# Patient Record
Sex: Female | Born: 1970
Health system: Southern US, Community
[De-identification: ages and names within clinical notes are randomized; demographics above are authoritative.]

## PROBLEM LIST (undated history)

## (undated) DIAGNOSIS — F419 Anxiety disorder, unspecified: Secondary | ICD-10-CM

## (undated) DIAGNOSIS — F32A Depression, unspecified: Secondary | ICD-10-CM

## (undated) DIAGNOSIS — F329 Major depressive disorder, single episode, unspecified: Secondary | ICD-10-CM

## (undated) HISTORY — DX: Anxiety disorder, unspecified: F41.9

## (undated) HISTORY — DX: Major depressive disorder, single episode, unspecified: F32.9

## (undated) HISTORY — DX: Depression, unspecified: F32.A

## (undated) HISTORY — PX: OTHER SURGICAL HISTORY: SHX169

---
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
## 2012-02-29 ENCOUNTER — Encounter: Payer: Self-pay | Admitting: Family Medicine

## 2012-02-29 ENCOUNTER — Ambulatory Visit (INDEPENDENT_AMBULATORY_CARE_PROVIDER_SITE_OTHER): Payer: 59 | Admitting: Family Medicine

## 2012-02-29 VITALS — BP 120/88 | HR 84 | Temp 98.1°F | Ht 62.0 in | Wt 161.0 lb

## 2012-02-29 DIAGNOSIS — Z1231 Encounter for screening mammogram for malignant neoplasm of breast: Secondary | ICD-10-CM

## 2012-02-29 DIAGNOSIS — Z23 Encounter for immunization: Secondary | ICD-10-CM

## 2012-02-29 DIAGNOSIS — F329 Major depressive disorder, single episode, unspecified: Secondary | ICD-10-CM

## 2012-02-29 DIAGNOSIS — Z139 Encounter for screening, unspecified: Secondary | ICD-10-CM

## 2012-02-29 DIAGNOSIS — F419 Anxiety disorder, unspecified: Secondary | ICD-10-CM | POA: Insufficient documentation

## 2012-02-29 MED ORDER — BUPROPION HCL ER (XL) 150 MG PO TB24: 150.0000 mg | ORAL_TABLET | Freq: Every day | ORAL | Status: DC

## 2012-02-29 MED ORDER — ALPRAZOLAM 0.5 MG PO TABS: ORAL_TABLET | ORAL | Status: DC

## 2012-02-29 NOTE — Patient Instructions (Addendum)
Please make an appointment for a complete physical/pap smear on your way out in one month. We will check blood work at that time.  Please stop by to see Regina Salazar on your way out to set up your mammogram.

## 2012-02-29 NOTE — Progress Notes (Signed)
  Subjective:    Patient ID: Regina Salazar, female    DOB: March 25, 1971, 41 y.o.   MRN: [id number]  HPI  Very pleasant 41 yo here to establish care.  Moved here from [place] one year ago.  The transition is not going well.  She misses her friends terribly, just started working again. No history of anxiety or depression but past 6 months, feels depressed. Tearful, does not want to get out of bed in the morning. No panic attacks.  Sleeping ok. Feels appetite is "too good." No SI or HI.  G0- 3 adopted children.  Has never had a pap smear as she was always to anxious to finish the procedure.  Mom died of breast CA in her 70s.  No other females in family have had breast, cervical or uterine cancer that she is aware of.  She herself has never had a mammogram.  Patient Active Problem List  Diagnosis  . Depression   No past medical history on file. No past surgical history on file. History  Substance Use Topics  . Smoking status: Never Smoker   . Smokeless tobacco: Not on file  . Alcohol Use: Not on file   Family History  Problem Relation Age of Onset  . Cancer Mother 26    breast  . Cancer Father     colon  . Diabetes Father    No Known Allergies Current Outpatient Prescriptions on File Prior to Visit  Medication Sig Dispense Refill  . buPROPion (WELLBUTRIN XL) 150 MG 24 hr tablet Take 1 tablet (150 mg total) by mouth daily.  30 tablet  2   The PMH, PSH, Social History, Family History, Medications, and allergies have been reviewed in [facility], and have been updated if relevant.   Review of Systems See HPI    Objective:   Physical Exam BP 120/88  Pulse 84  Temp 98.1 F (36.7 C)  Ht 5\' 2"  (1.575 m)  Wt 161 lb (73.029 kg)  BMI 29.45 kg/m2  General:  Well-developed,well-nourished,in no acute distress; alert,appropriate and cooperative throughout examination Head:  normocephalic and atraumatic.   Lungs:  Normal respiratory effort, chest expands symmetrically. Lungs are  clear to auscultation, no crackles or wheezes. Heart:  Normal rate and regular rhythm. S1 and S2 normal without gallop, murmur, click, rub or other extra sounds. Neurologic:  alert & oriented X3 and gait normal.   Skin:  Intact without suspicious lesions or rashes Psych:  Cognition and judgment appear intact. Alert and cooperative with normal attention span and concentration. No apparent delusions, illusions, hallucinations        Assessment & Plan:   1. Other screening mammogram  Pos FH of breast CA Given rx for xanax to take prior to CPX - she will schedule CPX/Pap on her way out. MM Digital Screening  2. Depression  New- discussed treatment options.  She would like to try medication first- has a good support system at home.  Will start Wellbutrin 150 mg XL daily. Follow up in 1 month.   3. Immunization due  Tdap vaccine greater than or equal to 7yo IM  4. Need for prophylactic vaccination and inoculation against influenza  Flu vaccine greater than or equal to 3yo preservative free IM  5. Screening  TB Skin Test

## 2012-03-03 LAB — TB SKIN TEST: TB Skin Test: NEGATIVE

## 2012-03-19 ENCOUNTER — Ambulatory Visit
Admission: RE | Admit: 2012-03-19 | Discharge: 2012-03-19 | Disposition: A | Discharge status: Home / Self Care | Payer: 59 | Source: Ambulatory Visit | Attending: Family Medicine | Admitting: Family Medicine

## 2012-03-19 DIAGNOSIS — Z1231 Encounter for screening mammogram for malignant neoplasm of breast: Secondary | ICD-10-CM

## 2012-04-29 ENCOUNTER — Ambulatory Visit (INDEPENDENT_AMBULATORY_CARE_PROVIDER_SITE_OTHER): Payer: 59 | Admitting: Family Medicine

## 2012-04-29 VITALS — BP 138/92 | HR 76 | Temp 98.1°F | Ht 62.0 in | Wt 149.0 lb

## 2012-04-29 DIAGNOSIS — Z136 Encounter for screening for cardiovascular disorders: Secondary | ICD-10-CM

## 2012-04-29 DIAGNOSIS — F329 Major depressive disorder, single episode, unspecified: Secondary | ICD-10-CM

## 2012-04-29 DIAGNOSIS — Z0000 Encounter for general adult medical examination without abnormal findings: Secondary | ICD-10-CM

## 2012-04-29 LAB — LIPID PANEL
Cholesterol: 203 mg/dL — ABNORMAL HIGH (ref 0–200)
Total CHOL/HDL Ratio: 5
Triglycerides: 88 mg/dL (ref 0.0–149.0)
VLDL: 17.6 mg/dL (ref 0.0–40.0)

## 2012-04-29 LAB — COMPREHENSIVE METABOLIC PANEL
ALT: 16 U/L (ref 0–35)
AST: 21 U/L (ref 0–37)
Albumin: 4 g/dL (ref 3.5–5.2)
Alkaline Phosphatase: 49 U/L (ref 39–117)
Calcium: 8.6 mg/dL (ref 8.4–10.5)
Chloride: 105 mEq/L (ref 96–112)
Potassium: 3.8 mEq/L (ref 3.5–5.1)
Sodium: 136 mEq/L (ref 135–145)
Total Protein: 7.3 g/dL (ref 6.0–8.3)

## 2012-04-29 MED ORDER — BUPROPION HCL ER (XL) 150 MG PO TB24: 150.0000 mg | ORAL_TABLET | Freq: Every day | ORAL | Status: DC

## 2012-04-29 NOTE — Patient Instructions (Addendum)
Please schedule complete physical/pap smear in the next week or two (ok to use two acute slots).  You look great!  I am so proud of you!

## 2012-04-29 NOTE — Progress Notes (Signed)
  Subjective:    Patient ID: Regina Salazar, female    DOB: [birth year], 41 y.o.   MRN: [id number]  HPI  Very pleasant 41 yo initially here for CPX/ Pap smear but she states she is on her menstrual cycle and bleeding heavily.    She would like to follow up depression.  Moved here from [place] one year ago.  The transition was not going well.  She had been feeling very depressed, tearful with significant anhedonia.  No SI or HI.  Started Wellbutrin 150 mg XL when she established care here in September.  She feels this is working tremendously well at current dose.  Has not been tearful, "feels like her old self again."  Enjoying things again.      Patient Active Problem List  Diagnosis  . Depression  . Routine general medical examination at a health care facility   No past medical history on file. No past surgical history on file. History  Substance Use Topics  . Smoking status: Never Smoker   . Smokeless tobacco: Not on file  . Alcohol Use: Not on file   Family History  Problem Relation Age of Onset  . Cancer Mother 46    breast  . Cancer Father     colon  . Diabetes Father    No Known Allergies Current Outpatient Prescriptions on File Prior to Visit  Medication Sig Dispense Refill  . ALPRAZolam (XANAX) 0.5 MG tablet Please take 1 tablet 30 minutes prior to procedure, may repeat in 30 minutes.  3 tablet  0  . buPROPion (WELLBUTRIN XL) 150 MG 24 hr tablet Take 1 tablet (150 mg total) by mouth daily.  30 tablet  2   The PMH, PSH, Social History, Family History, Medications, and allergies have been reviewed in [facility], and have been updated if relevant.   Review of Systems See HPI    Objective:   Physical Exam Ht 5\' 2"  (1.575 m)  Wt 149 lb (67.586 kg)  BMI 27.25 kg/m2  General:  Well-developed,well-nourished,in no acute distress; alert,appropriate and cooperative throughout examination Head:  normocephalic and atraumatic.   Neurologic:  alert & oriented X3 and gait  normal.   Skin:  Intact without suspicious lesions or rashes Psych:  Cognition and judgment appear intact. Alert and cooperative with normal attention span and concentration. No apparent delusions, illusions, hallucinations     Assessment & Plan:   1. Depression    >15 min spent with face to face with patient, >50% counseling and/or coordinating care. Improved on current dose of Wellbutrin.  Rx refilled.  Call or return to clinic prn if these symptoms worsen or fail to improve as anticipated.

## 2012-05-12 ENCOUNTER — Ambulatory Visit: Payer: 59

## 2012-05-12 ENCOUNTER — Ambulatory Visit (INDEPENDENT_AMBULATORY_CARE_PROVIDER_SITE_OTHER): Payer: 59 | Admitting: Family Medicine

## 2012-05-12 ENCOUNTER — Encounter: Payer: Self-pay | Admitting: Family Medicine

## 2012-05-12 VITALS — BP 130/84 | HR 88 | Temp 98.0°F | Wt 149.0 lb

## 2012-05-12 DIAGNOSIS — Z124 Encounter for screening for malignant neoplasm of cervix: Secondary | ICD-10-CM

## 2012-05-12 DIAGNOSIS — Z0000 Encounter for general adult medical examination without abnormal findings: Secondary | ICD-10-CM

## 2012-05-12 DIAGNOSIS — F329 Major depressive disorder, single episode, unspecified: Secondary | ICD-10-CM

## 2012-05-12 NOTE — Progress Notes (Signed)
Subjective:    Patient ID: Regina Salazar, female    DOB: Nov 14, 1970, 41 y.o.   MRN: [id number]  HPI  Very pleasant 41 yo initially here for CPX/ Pap smear.  Mammogram in September.  G0.  Last pap smear was attempted several years ago.  She has never had a pap smear due to severe anxiety and pain with the procedure. I gave her rx for xanax to take 30 minutes prior to today's procedure.    Mild hyperglycemia- she did have M and M's the night prior.  No increased thirst or urination.   Lab Results  Component Value Date   CHOL 203* 04/29/2012   HDL 37.70* 04/29/2012   LDLDIRECT 147.3 04/29/2012   TRIG 88.0 04/29/2012   CHOLHDL 5 04/29/2012    Depression- Started Wellbutrin 150 mg XL when she established care here in September.  She feels this is working tremendously well at current dose.  Has not been tearful, "feels like her old self again."  Enjoying things again.      Patient Active Problem List  Diagnosis  . Depression  . Routine general medical examination at a health care facility   No past medical history on file. No past surgical history on file. History  Substance Use Topics  . Smoking status: Never Smoker   . Smokeless tobacco: Not on file  . Alcohol Use: Not on file   Family History  Problem Relation Age of Onset  . Cancer Mother 44    breast  . Cancer Father     colon  . Diabetes Father    No Known Allergies Current Outpatient Prescriptions on File Prior to Visit  Medication Sig Dispense Refill  . ALPRAZolam (XANAX) 0.5 MG tablet Please take 1 tablet 30 minutes prior to procedure, may repeat in 30 minutes.  3 tablet  0  . buPROPion (WELLBUTRIN XL) 150 MG 24 hr tablet Take 1 tablet (150 mg total) by mouth daily.  30 tablet  11   The PMH, PSH, Social History, Family History, Medications, and allergies have been reviewed in [facility], and have been updated if relevant.   Review of Systems See HPI Patient reports no  vision/ hearing changes,anorexia, weight  change, fever ,adenopathy, persistant / recurrent hoarseness, swallowing issues, chest pain, edema,persistant / recurrent cough, hemoptysis, dyspnea(rest, exertional, paroxysmal nocturnal), gastrointestinal  bleeding (melena, rectal bleeding), abdominal pain, excessive heart burn, GU symptoms(dysuria, hematuria, pyuria, voiding/incontinence  Issues) syncope, focal weakness, severe memory loss, concerning skin lesions, depression, anxiety, abnormal bruising/bleeding, major joint swelling, breast masses or abnormal vaginal bleeding.       Objective:   Physical Exam BP 130/84  Pulse 88  Temp 98 F (36.7 C)  Wt 149 lb (67.586 kg)   General:  Well-developed,well-nourished,in no acute distress; alert,appropriate and cooperative throughout examination Head:  normocephalic and atraumatic.   Eyes:  vision grossly intact, pupils equal, pupils round, and pupils reactive to light.   Ears:  R ear normal and L ear normal.   Nose:  no external deformity.   Mouth:  good dentition.   Lungs:  Normal respiratory effort, chest expands symmetrically. Lungs are clear to auscultation, no crackles or wheezes. Heart:  Normal rate and regular rhythm. S1 and S2 normal without gallop, murmur, click, rub or other extra sounds. Abdomen:  Bowel sounds positive,abdomen soft and non-tender without masses, organomegaly or hernias noted. Rectal:  no external abnormalities.   Genitalia:  Pelvic Exam:        External: normal female genitalia  without lesions or masses       Attempted pap smear but pt unable to tolerate Msk:  No deformity or scoliosis noted of thoracic or lumbar spine.   Extremities:  No clubbing, cyanosis, edema, or deformity noted with normal full range of motion of all joints.   Neurologic:  alert & oriented X3 and gait normal.   Skin:  Intact without suspicious lesions or rashes Cervical Nodes:  No lymphadenopathy noted Axillary Nodes:  No palpable lymphadenopathy Psych:  Cognition and judgment appear  intact. Alert and cooperative with normal attention span and concentration. No apparent delusions, illusions, hallucinations     Assessment & Plan:   1. Routine general medical examination at a health care facility  Reviewed preventive care protocols, scheduled due services, and updated immunizations Discussed nutrition, exercise, diet, and healthy lifestyle.    2. Depression  Improved with Wellbutrin.   3. Screening for cervical cancer  Refer to GYN- will likely need pap smear under sedation. The patient indicates understanding of these issues and agrees with the plan.  Ambulatory referral to Gynecology

## 2012-05-12 NOTE — Patient Instructions (Addendum)
Good to see you. Please stop by to see [name] on your way out.  Have a wonderful holiday.

## 2012-05-19 ENCOUNTER — Other Ambulatory Visit: Payer: Self-pay | Admitting: *Deleted

## 2012-05-19 MED ORDER — BUPROPION HCL ER (XL) 150 MG PO TB24: 150.0000 mg | ORAL_TABLET | Freq: Every day | ORAL | Status: DC

## 2012-05-20 ENCOUNTER — Ambulatory Visit (INDEPENDENT_AMBULATORY_CARE_PROVIDER_SITE_OTHER): Payer: 59 | Admitting: Family Medicine

## 2012-05-20 ENCOUNTER — Encounter: Payer: Self-pay | Admitting: Family Medicine

## 2012-05-20 VITALS — BP 133/88 | HR 95 | Ht 62.0 in | Wt 150.0 lb

## 2012-05-20 DIAGNOSIS — F411 Generalized anxiety disorder: Secondary | ICD-10-CM

## 2012-05-20 NOTE — Progress Notes (Signed)
  Subjective:    Patient ID: Regina Salazar, female    DOB: 1970/12/21, 41 y.o.   MRN: [id number]  HPI New pt. Today.  Referred for inability to have pap smear done.  Reports difficulty with intercourse as well.  States she is just too panicked to have it done and nervous.  Denies any h/o of abuse or trauma as a child.  Has never had a pap.  Has never been pregnant.  She does have normal cycles.  Past Medical History  Diagnosis Date  . Depression    Past Surgical History  Procedure Date  . Cystectomy on writst 1980   Family History  Problem Relation Age of Onset  . Cancer Mother 92    breast  . Cancer Father     colon  . Diabetes Father    History   Social History  . Marital Status: Married    Spouse Name: N/A    Number of Children: N/A  . Years of Education: N/A   Occupational History  . Not on file.   Social History Main Topics  . Smoking status: Never Smoker   . Smokeless tobacco: Never Used  . Alcohol Use: No  . Drug Use: No  . Sexually Active: Yes -- Female partner(s)    Birth Control/ Protection: None   Other Topics Concern  . Not on file   Social History Narrative   Moved here from [place] last year for her husband's job.3 adopted children ages 9, 34, 65.Works at a daycare.   Current Outpatient Prescriptions on File Prior to Visit  Medication Sig Dispense Refill  . buPROPion (WELLBUTRIN XL) 150 MG 24 hr tablet Take 1 tablet (150 mg total) by mouth daily.  90 tablet  3   No Known Allergies    Review of Systems  Constitutional: Negative for fever and chills.  HENT: Negative for nosebleeds, congestion and rhinorrhea.   Eyes: Negative for visual disturbance.  Respiratory: Negative for chest tightness and shortness of breath.   Cardiovascular: Negative for chest pain.  Gastrointestinal: Negative for nausea, vomiting, abdominal pain, diarrhea, constipation, blood in stool and abdominal distention.  Genitourinary: Negative for dysuria, frequency, vaginal  bleeding and menstrual problem.  Musculoskeletal: Negative for arthralgias.  Skin: Negative for rash.  Neurological: Negative for dizziness and headaches.  Psychiatric/Behavioral: Negative for behavioral problems, sleep disturbance and dysphoric mood.  All other systems reviewed and are negative.       Objective:   Physical Exam  Constitutional: She is oriented to person, place, and time. She appears well-developed and well-nourished.  HENT:  Head: Normocephalic and atraumatic.  Eyes: No scleral icterus.  Neck: Normal range of motion. No thyromegaly present.  Cardiovascular: Normal rate, regular rhythm and intact distal pulses.   No murmur heard. Pulmonary/Chest: Effort normal and breath sounds normal.  Abdominal: Soft. She exhibits no distension. There is no tenderness.  Neurological: She is alert and oriented to person, place, and time.  Skin: Skin is warm and dry.          Assessment & Plan:  Anxiety with pap smear--will book for EUA with pap this month.

## 2012-05-28 ENCOUNTER — Encounter (HOSPITAL_COMMUNITY): Payer: Self-pay | Admitting: Pharmacist

## 2012-06-05 ENCOUNTER — Ambulatory Visit (HOSPITAL_COMMUNITY): Payer: 59 | Admitting: Anesthesiology

## 2012-06-05 ENCOUNTER — Ambulatory Visit (HOSPITAL_COMMUNITY)
Admission: RE | Admit: 2012-06-05 | Discharge: 2012-06-05 | Disposition: A | Discharge status: Home / Self Care | Payer: 59 | Source: Ambulatory Visit | Attending: Family Medicine | Admitting: Family Medicine

## 2012-06-05 ENCOUNTER — Encounter (HOSPITAL_COMMUNITY): Payer: Self-pay | Admitting: Anesthesiology

## 2012-06-05 ENCOUNTER — Encounter (HOSPITAL_COMMUNITY)
Admission: RE | Disposition: A | Discharge status: Home / Self Care | Payer: Self-pay | Source: Ambulatory Visit | Attending: Family Medicine

## 2012-06-05 DIAGNOSIS — Z01419 Encounter for gynecological examination (general) (routine) without abnormal findings: Secondary | ICD-10-CM | POA: Diagnosis not present

## 2012-06-05 DIAGNOSIS — Z0000 Encounter for general adult medical examination without abnormal findings: Secondary | ICD-10-CM

## 2012-06-05 DIAGNOSIS — Z124 Encounter for screening for malignant neoplasm of cervix: Secondary | ICD-10-CM

## 2012-06-05 HISTORY — PX: EXAMINATION UNDER ANESTHESIA: SHX1540

## 2012-06-05 LAB — CBC
Hemoglobin: 14.8 g/dL (ref 12.0–15.0)
Platelets: 316 10*3/uL (ref 150–400)
RBC: 4.81 MIL/uL (ref 3.87–5.11)

## 2012-06-05 LAB — PREGNANCY, URINE: Preg Test, Ur: NEGATIVE

## 2012-06-05 SURGERY — EXAM UNDER ANESTHESIA
Anesthesia: Monitor Anesthesia Care | Site: Vagina | Wound class: Clean Contaminated

## 2012-06-05 MED ORDER — MIDAZOLAM HCL 2 MG/2ML IJ SOLN: 0.5000 mg | Freq: Once | INTRAMUSCULAR | Status: DC | PRN

## 2012-06-05 MED ORDER — PROMETHAZINE HCL 25 MG/ML IJ SOLN: 6.2500 mg | INTRAMUSCULAR | Status: DC | PRN

## 2012-06-05 MED ORDER — MIDAZOLAM HCL 2 MG/2ML IJ SOLN
INTRAMUSCULAR | Status: AC
  Filled 2012-06-05: qty 2

## 2012-06-05 MED ORDER — MEPERIDINE HCL 25 MG/ML IJ SOLN: 6.2500 mg | INTRAMUSCULAR | Status: DC | PRN

## 2012-06-05 MED ORDER — LIDOCAINE HCL (CARDIAC) 20 MG/ML IV SOLN
INTRAVENOUS | Status: AC
  Filled 2012-06-05: qty 5

## 2012-06-05 MED ORDER — PROPOFOL 10 MG/ML IV EMUL
INTRAVENOUS | Status: AC
  Filled 2012-06-05: qty 20

## 2012-06-05 MED ORDER — KETOROLAC TROMETHAMINE 30 MG/ML IJ SOLN: 15.0000 mg | Freq: Once | INTRAMUSCULAR | Status: DC | PRN

## 2012-06-05 MED ORDER — ONDANSETRON HCL 4 MG/2ML IJ SOLN
INTRAMUSCULAR | Status: AC
  Filled 2012-06-05: qty 2

## 2012-06-05 MED ORDER — ONDANSETRON HCL 4 MG/2ML IJ SOLN
INTRAMUSCULAR | Status: DC | PRN
  Administered 2012-06-05: 4 mg via INTRAVENOUS

## 2012-06-05 MED ORDER — FENTANYL CITRATE 0.05 MG/ML IJ SOLN: 25.0000 ug | INTRAMUSCULAR | Status: DC | PRN

## 2012-06-05 MED ORDER — FENTANYL CITRATE 0.05 MG/ML IJ SOLN
INTRAMUSCULAR | Status: AC
  Filled 2012-06-05: qty 2

## 2012-06-05 MED ORDER — FENTANYL CITRATE 0.05 MG/ML IJ SOLN
INTRAMUSCULAR | Status: DC | PRN
  Administered 2012-06-05: 100 ug via INTRAVENOUS

## 2012-06-05 MED ORDER — LACTATED RINGERS IV SOLN
INTRAVENOUS | Status: DC
  Administered 2012-06-05 (×2): via INTRAVENOUS

## 2012-06-05 MED ORDER — LIDOCAINE HCL (CARDIAC) 20 MG/ML IV SOLN
INTRAVENOUS | Status: DC | PRN
  Administered 2012-06-05 (×2): 50 mg via INTRAVENOUS

## 2012-06-05 MED ORDER — LACTATED RINGERS IV SOLN: INTRAVENOUS | Status: DC

## 2012-06-05 MED ORDER — PROPOFOL 10 MG/ML IV EMUL
INTRAVENOUS | Status: DC | PRN
  Administered 2012-06-05: 30 mg via INTRAVENOUS
  Administered 2012-06-05: 20 mg via INTRAVENOUS

## 2012-06-05 MED ORDER — MIDAZOLAM HCL 5 MG/5ML IJ SOLN
INTRAMUSCULAR | Status: DC | PRN
  Administered 2012-06-05: 2 mg via INTRAVENOUS

## 2012-06-05 SURGICAL SUPPLY — 6 items
COVER TABLE BACK 60X90 (DRAPES) ×2 IMPLANT
GLOVE BIO SURGEON STRL SZ 6.5 (GLOVE) ×2 IMPLANT
GLOVE BIOGEL PI IND STRL 7.0 (GLOVE) ×1 IMPLANT
GLOVE BIOGEL PI INDICATOR 7.0 (GLOVE) ×1
SPONGE GAUZE 4X4 12PLY (GAUZE/BANDAGES/DRESSINGS) ×2 IMPLANT
TOWEL OR 17X24 6PK STRL BLUE (TOWEL DISPOSABLE) ×2 IMPLANT

## 2012-06-05 NOTE — Transfer of Care (Signed)
Immediate Anesthesia Transfer of Care Note  Patient: Regina Salazar  Procedure(s) Performed: Procedure(s) (LRB) with comments: EXAM UNDER ANESTHESIA (N/A) - pap testing  Patient Location: PACU  Anesthesia Type:MAC  Level of Consciousness: awake, alert  and oriented  Airway & Oxygen Therapy: Patient Spontanous Breathing  Post-op Assessment: Report given to PACU RN and Post -op Vital signs reviewed and stable  Post vital signs: Reviewed and stable  Complications: No apparent anesthesia complications

## 2012-06-05 NOTE — Op Note (Signed)
Preoperative diagnosis: Unable to tolerate exam in the office, Need for Pap smear screening  Postoperative diagnosis: Same  Procedure: Exam under anesthesia, Pap smear  Surgeon: Tinnie Gens, M.D.  Anesthesia: MAC  Findings: 8 week size uterus, nulliparous-appearing cervix  without lesion  Estimated blood loss: Less than 25 cc  Specimen: None  Reason for procedure: Patient is a 41 year old nulliparous patient who is unable to tolerate exam in the office. She has never had a Pap smear. She requests screening.  Procedure: Patient taken to the OR where she was placed in dorsal lithotomy in Allen stirrups. A timeout was performed. A speculum was placed inside the vagina. The cervix was visualized and Pap smear obtained. The speculum was removed from the vagina. An exam revealed 8 week size uterus that was firm. There were no adnexal mass or tenderness. The patient was awakened. All instrument, needle, and lap counts correct x2. The patient was taken to recovery, stable.  Reva Bores, MD 06/05/2012 10:52 AM

## 2012-06-05 NOTE — Anesthesia Postprocedure Evaluation (Signed)
Anesthesia Post Note  Patient: Regina Salazar  Procedure(s) Performed: Procedure(s) (LRB): EXAM UNDER ANESTHESIA (N/A)  Anesthesia type: MAC  Patient location: PACU  Post pain: Pain level controlled  Post assessment: Post-op Vital signs reviewed  Last Vitals:  Filed Vitals:   06/05/12 1100  BP: 107/68  Pulse: 75  Temp:   Resp: 20    Post vital signs: Reviewed  Level of consciousness: sedated  Complications: No apparent anesthesia complications

## 2012-06-05 NOTE — H&P (Signed)
  Justise Ehmann is an 41 y.o. G0P0 Unknown female.   Chief Complaint: Needs pap smear  HPI: 41 y.o.G0P0 who has never had a pap smear and is in need of pap.  Cannot tolerate procedure in the OR.   Past Medical History  Diagnosis Date  . Depression     Past Surgical History  Procedure Date  . Cystectomy on writst 1980    Family History  Problem Relation Age of Onset  . Cancer Mother 45    breast  . Cancer Father     colon  . Diabetes Father    Social History:  reports that she has never smoked. She has never used smokeless tobacco. She reports that she does not drink alcohol or use illicit drugs.  Allergies: No Known Allergies  Medications Prior to Admission  Medication Sig Dispense Refill  . buPROPion (WELLBUTRIN XL) 150 MG 24 hr tablet Take 1 tablet (150 mg total) by mouth daily.  90 tablet  3  . ibuprofen (ADVIL,MOTRIN) 200 MG tablet Take 400 mg by mouth as needed. headache         A comprehensive review of systems was negative.  Blood pressure 128/98, pulse 68, temperature 97.7 F (36.5 C), temperature source Oral, resp. rate 16, height 5\' 2"  (1.575 m), weight 150 lb (68.04 kg), last menstrual period 04/29/2012, SpO2 100.00%. BP 128/98  Pulse 68  Temp 97.7 F (36.5 C) (Oral)  Resp 16  Ht 5\' 2"  (1.575 m)  Wt 150 lb (68.04 kg)  BMI 27.44 kg/m2  SpO2 100%  LMP 04/29/2012 General appearance: alert, cooperative and appears stated age Head: Normocephalic, without obvious abnormality, atraumatic Neck: supple, symmetrical, trachea midline Lungs: clear to auscultation bilaterally Heart: regular rate and rhythm, S1, S2 normal, no murmur, click, rub or gallop Abdomen: soft, non-tender; bowel sounds normal; no masses,  no organomegaly Extremities: extremities normal, atraumatic, no cyanosis or edema Pulses: 2+ and symmetric Skin: Skin color, texture, turgor normal. No rashes or lesions Neurologic: Grossly normal   Lab Results  Component Value Date   WBC 7.6  06/05/2012   HGB 14.8 06/05/2012   HCT 43.2 06/05/2012   MCV 89.8 06/05/2012   PLT 316 06/05/2012   Lab Results  Component Value Date   PREGTESTUR NEGATIVE 06/05/2012     Assessment/Plan Patient Active Problem List  Diagnosis  . Depression  . Routine general medical examination at a health care facility   For exam under anesthesia with pap smear.  Hayven Croy S 06/05/2012, 10:10 AM

## 2012-06-05 NOTE — Anesthesia Preprocedure Evaluation (Addendum)
Anesthesia Evaluation  Patient identified by MRN, date of birth, ID band Patient awake    Reviewed: Allergy & Precautions, H&P , Patient's Chart, lab work & pertinent test results, reviewed documented beta blocker date and time   History of Anesthesia Complications Negative for: history of anesthetic complications  Airway Mallampati: II TM Distance: >3 FB Neck ROM: full    Dental No notable dental hx.    Pulmonary neg pulmonary ROS,  breath sounds clear to auscultation  Pulmonary exam normal       Cardiovascular Exercise Tolerance: Good negative cardio ROS  Rhythm:regular Rate:Normal     Neuro/Psych PSYCHIATRIC DISORDERS Depression negative neurological ROS  negative psych ROS   GI/Hepatic negative GI ROS, Neg liver ROS,   Endo/Other  negative endocrine ROS  Renal/GU negative Renal ROS     Musculoskeletal   Abdominal   Peds  Hematology negative hematology ROS (+)   Anesthesia Other Findings   Reproductive/Obstetrics negative OB ROS                           Anesthesia Physical Anesthesia Plan  ASA: II  Anesthesia Plan: MAC   Post-op Pain Management:    Induction:   Airway Management Planned:   Additional Equipment:   Intra-op Plan:   Post-operative Plan:   Informed Consent: I have reviewed the patients History and Physical, chart, labs and discussed the procedure including the risks, benefits and alternatives for the proposed anesthesia with the patient or authorized representative who has indicated his/her understanding and acceptance.   Dental Advisory Given  Plan Discussed with: CRNA, Surgeon and Anesthesiologist  Anesthesia Plan Comments:         Anesthesia Quick Evaluation

## 2012-06-06 ENCOUNTER — Encounter (HOSPITAL_COMMUNITY): Payer: Self-pay | Admitting: Family Medicine

## 2012-07-21 ENCOUNTER — Ambulatory Visit (INDEPENDENT_AMBULATORY_CARE_PROVIDER_SITE_OTHER): Payer: 59 | Admitting: Family Medicine

## 2012-07-21 ENCOUNTER — Encounter: Payer: Self-pay | Admitting: Family Medicine

## 2012-07-21 VITALS — BP 130/82 | HR 84 | Temp 98.0°F | Wt 146.0 lb

## 2012-07-21 DIAGNOSIS — J329 Chronic sinusitis, unspecified: Secondary | ICD-10-CM

## 2012-07-21 MED ORDER — AMOXICILLIN-POT CLAVULANATE 875-125 MG PO TABS: 1.0000 | ORAL_TABLET | Freq: Two times a day (BID) | ORAL | Status: DC

## 2012-07-21 NOTE — Progress Notes (Signed)
SUBJECTIVE:  Regina Salazar is a 42 y.o. female who complains of coryza, congestion, sneezing, sore throat, myalgias and bilateral sinus pain for 21 days. She denies a history of anorexia, chest pain and chills and denies a history of asthma. Patient denies smoke cigarettes.   Patient Active Problem List  Diagnosis  . Depression  . Routine general medical examination at a health care facility   Past Medical History  Diagnosis Date  . Depression    Past Surgical History  Procedure Date  . Cystectomy on writst 1980  . Examination under anesthesia 06/05/2012    Procedure: EXAM UNDER ANESTHESIA;  Surgeon: Reva Bores, MD;  Location: [facility];  Service: Gynecology;  Laterality: N/A;  pap testing   History  Substance Use Topics  . Smoking status: Never Smoker   . Smokeless tobacco: Never Used  . Alcohol Use: No   Family History  Problem Relation Age of Onset  . Cancer Mother 56    breast  . Cancer Father     colon  . Diabetes Father    No Known Allergies Current Outpatient Prescriptions on File Prior to Visit  Medication Sig Dispense Refill  . buPROPion (WELLBUTRIN XL) 150 MG 24 hr tablet Take 1 tablet (150 mg total) by mouth daily.  90 tablet  3  . ibuprofen (ADVIL,MOTRIN) 200 MG tablet Take 400 mg by mouth as needed. headache       The PMH, PSH, Social History, Family History, Medications, and allergies have been reviewed in [facility], and have been updated if relevant.  OBJECTIVE: BP 130/82  Pulse 84  Temp 98 F (36.7 C)  Wt 146 lb (66.225 kg)  She appears well, vital signs are as noted. Ears normal.  Throat and pharynx normal.  Neck supple. No adenopathy in the neck. Nose is congested. Maxillary sinuses very TTP, left > right The chest is clear, without wheezes or rales.  ASSESSMENT:  sinusitis  PLAN: Given duration and progression of symptoms, will treat for bacterial sinusitis with 10 day course of Augmentin. Symptomatic therapy suggested: push fluids, rest and return  office visit prn if symptoms persist or worsen. Call or return to clinic prn if these symptoms worsen or fail to improve as anticipated.

## 2012-07-21 NOTE — Patient Instructions (Signed)
Take antibiotic as directed- Augmentin - 1 tablet twice daily for 10 days.  Drink lots of fluids.  Treat sympotmatically with Mucinex, nasal saline irrigation, and Tylenol/Ibuprofen. Also try claritin D or zyrtec D over the counter- two times a day as needed ( have to sign for them at pharmacy). You can use warm compresses.  Cough suppressant at night. Call if not improving as expected in 5-7 days.

## 2013-04-28 ENCOUNTER — Other Ambulatory Visit: Payer: Self-pay | Admitting: Family Medicine

## 2013-04-28 NOTE — Telephone Encounter (Signed)
Received refill request electronically. Last office visit 07/21/12. Is it okay to refill medication?

## 2013-06-01 ENCOUNTER — Ambulatory Visit (INDEPENDENT_AMBULATORY_CARE_PROVIDER_SITE_OTHER): Payer: 59 | Admitting: Internal Medicine

## 2013-06-01 ENCOUNTER — Encounter: Payer: Self-pay | Admitting: Internal Medicine

## 2013-06-01 VITALS — BP 138/80 | HR 100 | Temp 98.5°F | Ht 62.25 in | Wt 145.0 lb

## 2013-06-01 DIAGNOSIS — F411 Generalized anxiety disorder: Secondary | ICD-10-CM

## 2013-06-01 DIAGNOSIS — F3289 Other specified depressive episodes: Secondary | ICD-10-CM

## 2013-06-01 DIAGNOSIS — F329 Major depressive disorder, single episode, unspecified: Secondary | ICD-10-CM

## 2013-06-01 MED ORDER — ALPRAZOLAM 0.5 MG PO TABS: 0.5000 mg | ORAL_TABLET | Freq: Every evening | ORAL | Status: DC | PRN

## 2013-06-01 MED ORDER — BUPROPION HCL ER (XL) 300 MG PO TB24: 300.0000 mg | ORAL_TABLET | Freq: Every day | ORAL | Status: DC

## 2013-06-01 NOTE — Assessment & Plan Note (Signed)
Situational but she did not want to discuss She feels like she would only need short term medication to treat for now Support offered RX for xanax 0.5 mg #30, no refills (short term only)

## 2013-06-01 NOTE — Progress Notes (Signed)
Subjective:    Patient ID: Regina Salazar, female    DOB: June 10, 1971, 42 y.o.   MRN: [id number]  HPI  Pt presents to the clinic today with c/o anxiety. She noticed that she was more anxious, starting 2 days ago. She noticed that her heart rate was up, tingly all over and difficulty sleeping. She did have a situational event happen 2 days ago. She does not want to discuss it. She is very tearful. She also thinks her wellbutrin needs to be increased.   Review of Systems      Past Medical History  Diagnosis Date  . Depression     Current Outpatient Prescriptions  Medication Sig Dispense Refill  . amoxicillin-clavulanate (AUGMENTIN) 875-125 MG per tablet Take 1 tablet by mouth 2 (two) times daily.  20 tablet  0  . buPROPion (WELLBUTRIN XL) 150 MG 24 hr tablet TAKE 1 TABLET DAILY  90 tablet  0  . ibuprofen (ADVIL,MOTRIN) 200 MG tablet Take 400 mg by mouth as needed. headache       No current facility-administered medications for this visit.    No Known Allergies  Family History  Problem Relation Age of Onset  . Cancer Mother 75    breast  . Cancer Father     colon  . Diabetes Father     History   Social History  . Marital Status: Married    Spouse Name: N/A    Number of Children: N/A  . Years of Education: N/A   Occupational History  . Not on file.   Social History Main Topics  . Smoking status: Never Smoker   . Smokeless tobacco: Never Used  . Alcohol Use: No  . Drug Use: No  . Sexual Activity: Yes    Partners: Male    Birth Control/ Protection: None   Other Topics Concern  . Not on file   Social History Narrative   Moved here from [place] last year for her husband's job.   3 adopted children ages 64, 41, 98.      Works at a daycare.     Constitutional: Denies fever, malaise, fatigue, headache or abrupt weight changes.  Respiratory: Denies difficulty breathing, shortness of breath, cough or sputum production.   Cardiovascular: Denies chest pain,  chest tightness, palpitations or swelling in the hands or feet.   Psych: Pt reports anxiety. Denies depression, SI/HI.  No other specific complaints in a complete review of systems (except as listed in HPI above).  Objective:   Physical Exam   BP 138/80  Pulse 100  Temp(Src) 98.5 F (36.9 C) (Oral)  Ht 5' 2.25" (1.581 m)  Wt 145 lb (65.772 kg)  BMI 26.31 kg/m2  SpO2 97%  LMP 05/04/2013 Wt Readings from Last 3 Encounters:  06/01/13 145 lb (65.772 kg)  07/21/12 146 lb (66.225 kg)  06/02/12 150 lb (68.04 kg)    General: Appears her stated age, well developed, well nourished in NAD.  Cardiovascular: Normal rate and rhythm. S1,S2 noted.  No murmur, rubs or gallops noted. No JVD or BLE edema. No carotid bruits noted. Pulmonary/Chest: Normal effort and positive vesicular breath sounds. No respiratory distress. No wheezes, rales or ronchi noted.  Psychiatric: Mood tearful and affect flat. Behavior is normal. Judgment and thought content normal.     BMET    Component Value Date/Time   NA 136 04/29/2012 0851   K 3.8 04/29/2012 0851   CL 105 04/29/2012 0851   CO2 20 04/29/2012 0851  GLUCOSE 106* 04/29/2012 0851   BUN 16 04/29/2012 0851   CREATININE 0.7 04/29/2012 0851   CALCIUM 8.6 04/29/2012 0851    Lipid Panel     Component Value Date/Time   CHOL 203* 04/29/2012 0851   TRIG 88.0 04/29/2012 0851   HDL 37.70* 04/29/2012 0851   CHOLHDL 5 04/29/2012 0851   VLDL 17.6 04/29/2012 0851    CBC    Component Value Date/Time   WBC 7.6 06/05/2012 0931   RBC 4.81 06/05/2012 0931   HGB 14.8 06/05/2012 0931   HCT 43.2 06/05/2012 0931   PLT 316 06/05/2012 0931   MCV 89.8 06/05/2012 0931   MCH 30.8 06/05/2012 0931   MCHC 34.3 06/05/2012 0931   RDW 12.5 06/05/2012 0931    Hgb A1C No results found for this basename: HGBA1C        Assessment & Plan:

## 2013-06-01 NOTE — Assessment & Plan Note (Signed)
Worse-situational Increased wellbutrin to 300 mg daily

## 2013-06-01 NOTE — Patient Instructions (Signed)

## 2013-06-01 NOTE — Progress Notes (Signed)
Pre-visit discussion using our clinic review tool. No additional management support is needed unless otherwise documented below in the visit note.  

## 2013-06-29 ENCOUNTER — Other Ambulatory Visit: Payer: Self-pay | Admitting: Internal Medicine

## 2013-06-29 NOTE — Telephone Encounter (Signed)
Last filled 12/15 and had an office visit with Webb Silversmith--

## 2013-07-02 ENCOUNTER — Encounter: Payer: Self-pay | Admitting: Internal Medicine

## 2013-07-02 ENCOUNTER — Ambulatory Visit (INDEPENDENT_AMBULATORY_CARE_PROVIDER_SITE_OTHER): Payer: 59 | Admitting: Internal Medicine

## 2013-07-02 VITALS — BP 110/80 | HR 79 | Temp 98.2°F | Wt 136.0 lb

## 2013-07-02 DIAGNOSIS — F341 Dysthymic disorder: Secondary | ICD-10-CM

## 2013-07-02 DIAGNOSIS — F329 Major depressive disorder, single episode, unspecified: Secondary | ICD-10-CM

## 2013-07-02 DIAGNOSIS — F419 Anxiety disorder, unspecified: Principal | ICD-10-CM

## 2013-07-02 MED ORDER — ALPRAZOLAM 0.5 MG PO TABS: 0.5000 mg | ORAL_TABLET | Freq: Every evening | ORAL | Status: DC | PRN

## 2013-07-02 NOTE — Assessment & Plan Note (Signed)
She has tried a few nights without the xanax and did have difficulty sleeping and worsening anxiety.  She would like to continue xanax for now but I reminded her this would be short term.

## 2013-07-02 NOTE — Assessment & Plan Note (Signed)
Better with increased dose Will continue 300 mg daily

## 2013-07-02 NOTE — Progress Notes (Signed)
Pre-visit discussion using our clinic review tool. No additional management support is needed unless otherwise documented below in the visit note.  

## 2013-07-02 NOTE — Patient Instructions (Signed)

## 2013-07-02 NOTE — Progress Notes (Signed)
Subjective:    Patient ID: Regina Salazar, female    DOB: [birth year], 43 y.o.   MRN: [id number]  HPI  Pt presents to the clinic today to f/u anxiety and depression. During the last visit, she was having situational issues that was causing worsening depression and increased anxiety. She was started on low dose xanax at night and her wellbutrin was increased. She reports that the medication changes have worked well. She is sleeping better and feeling less anxious. She denies SI/HI.  Review of Systems      Past Medical History  Diagnosis Date  . Depression     Current Outpatient Prescriptions  Medication Sig Dispense Refill  . ALPRAZolam (XANAX) 0.5 MG tablet Take 1 tablet (0.5 mg total) by mouth at bedtime as needed for anxiety.  30 tablet  0  . buPROPion (WELLBUTRIN XL) 300 MG 24 hr tablet Take 1 tablet (300 mg total) by mouth daily.  90 tablet  0  . ibuprofen (ADVIL,MOTRIN) 200 MG tablet Take 400 mg by mouth as needed. headache       No current facility-administered medications for this visit.    No Known Allergies  Family History  Problem Relation Age of Onset  . Cancer Mother 50    breast  . Cancer Father     colon  . Diabetes Father     History   Social History  . Marital Status: Married    Spouse Name: N/A    Number of Children: N/A  . Years of Education: N/A   Occupational History  . Not on file.   Social History Main Topics  . Smoking status: Never Smoker   . Smokeless tobacco: Never Used  . Alcohol Use: No  . Drug Use: No  . Sexual Activity: Yes    Partners: Male    Birth Control/ Protection: None   Other Topics Concern  . Not on file   Social History Narrative   Moved here from [place] last year for her husband's job.   3 adopted children ages 63, 47, 11.      Works at a daycare.     Constitutional: Denies fever, malaise, fatigue, headache or abrupt weight changes.   Neurological: Denies dizziness, difficulty with memory, difficulty with  speech or problems with balance and coordination.  Psych: Pt denies worsening depression, anxiety, SI/HI.  No other specific complaints in a complete review of systems (except as listed in HPI above).  Objective:   Physical Exam   BP 110/80  Pulse 79  Temp(Src) 98.2 F (36.8 C) (Oral)  Wt 136 lb (61.689 kg)  SpO2 98% Wt Readings from Last 3 Encounters:  07/02/13 136 lb (61.689 kg)  06/01/13 145 lb (65.772 kg)  07/21/12 146 lb (66.225 kg)    General: Appears her stated age, well developed, well nourished in NAD. Cardiovascular: Normal rate and rhythm. S1,S2 noted.  No murmur, rubs or gallops noted. No JVD or BLE edema. No carotid bruits noted. Pulmonary/Chest: Normal effort and positive vesicular breath sounds. No respiratory distress. No wheezes, rales or ronchi noted.   Neurological: Alert and oriented. Cranial nerves II-XII intact. Coordination normal. +DTRs bilaterally. Psychiatric: Mood and affect normal. Behavior is normal. Judgment and thought content normal.     BMET    Component Value Date/Time   NA 136 04/29/2012 0851   K 3.8 04/29/2012 0851   CL 105 04/29/2012 0851   CO2 20 04/29/2012 0851   GLUCOSE 106* 04/29/2012 0851   BUN 16  04/29/2012 0851   CREATININE 0.7 04/29/2012 0851   CALCIUM 8.6 04/29/2012 0851    Lipid Panel     Component Value Date/Time   CHOL 203* 04/29/2012 0851   TRIG 88.0 04/29/2012 0851   HDL 37.70* 04/29/2012 0851   CHOLHDL 5 04/29/2012 0851   VLDL 17.6 04/29/2012 0851    CBC    Component Value Date/Time   WBC 7.6 06/05/2012 0931   RBC 4.81 06/05/2012 0931   HGB 14.8 06/05/2012 0931   HCT 43.2 06/05/2012 0931   PLT 316 06/05/2012 0931   MCV 89.8 06/05/2012 0931   MCH 30.8 06/05/2012 0931   MCHC 34.3 06/05/2012 0931   RDW 12.5 06/05/2012 0931    Hgb A1C No results found for this basename: HGBA1C        Assessment & Plan:

## 2013-07-03 ENCOUNTER — Telehealth: Payer: Self-pay

## 2013-07-03 NOTE — Telephone Encounter (Signed)
Can you please make sure the xanax was called in yesterday?

## 2013-07-03 NOTE — Telephone Encounter (Signed)
Pt left v/m pt was seen 07/02/13; [facility] has not received alprazolam rx that was to be called in on 07/02/13.Please advise.pt request cb.

## 2013-07-07 ENCOUNTER — Encounter: Payer: Self-pay | Admitting: Radiology

## 2013-07-08 ENCOUNTER — Encounter: Payer: Self-pay | Admitting: Family Medicine

## 2013-07-08 ENCOUNTER — Ambulatory Visit (INDEPENDENT_AMBULATORY_CARE_PROVIDER_SITE_OTHER): Payer: 59 | Admitting: Family Medicine

## 2013-07-08 VITALS — BP 112/66 | HR 91 | Temp 98.0°F | Ht 62.25 in | Wt 136.0 lb

## 2013-07-08 DIAGNOSIS — F341 Dysthymic disorder: Secondary | ICD-10-CM

## 2013-07-08 DIAGNOSIS — F32A Depression, unspecified: Secondary | ICD-10-CM

## 2013-07-08 DIAGNOSIS — F329 Major depressive disorder, single episode, unspecified: Secondary | ICD-10-CM

## 2013-07-08 DIAGNOSIS — F411 Generalized anxiety disorder: Secondary | ICD-10-CM

## 2013-07-08 DIAGNOSIS — F419 Anxiety disorder, unspecified: Principal | ICD-10-CM

## 2013-07-08 MED ORDER — ALPRAZOLAM 0.5 MG PO TABS: 0.5000 mg | ORAL_TABLET | Freq: Every evening | ORAL | Status: DC | PRN

## 2013-07-08 NOTE — Assessment & Plan Note (Signed)
Stable on current rx. She is aware that xanax is short term. Rx refilled.

## 2013-07-08 NOTE — Assessment & Plan Note (Signed)
Improved with increased dose of Wellbutrin. No changes today.

## 2013-07-08 NOTE — Progress Notes (Signed)
Pre-visit discussion using our clinic review tool. No additional management support is needed unless otherwise documented below in the visit note.  

## 2013-07-08 NOTE — Progress Notes (Signed)
   Subjective:    Patient ID: Regina Salazar, female    DOB: 1971/02/21, 43 y.o.   MRN: [id number]  HPI  43 yo here for f/u anxiety and depression.  Saw Webb Silversmith last month and again last week for anxiety and depression.  She was started on low dose xanax at night and her wellbutrin was increased. She reports that the medication changes have worked well. She is sleeping better and feeling less anxious. She denies SI/HI.  Has had more stressors- financial.  Also taking Melatonin which seems to help.     Past Medical History  Diagnosis Date  . Depression     Current Outpatient Prescriptions  Medication Sig Dispense Refill  . ALPRAZolam (XANAX) 0.5 MG tablet Take 1 tablet (0.5 mg total) by mouth at bedtime as needed for anxiety.  30 tablet  0  . buPROPion (WELLBUTRIN XL) 300 MG 24 hr tablet Take 1 tablet (300 mg total) by mouth daily.  90 tablet  0  . ibuprofen (ADVIL,MOTRIN) 200 MG tablet Take 400 mg by mouth as needed. headache       No current facility-administered medications for this visit.    No Known Allergies  Family History  Problem Relation Age of Onset  . Cancer Mother 94    breast  . Cancer Father     colon  . Diabetes Father     History   Social History  . Marital Status: Married    Spouse Name: N/A    Number of Children: N/A  . Years of Education: N/A   Occupational History  . Not on file.   Social History Main Topics  . Smoking status: Never Smoker   . Smokeless tobacco: Never Used  . Alcohol Use: No  . Drug Use: No  . Sexual Activity: Yes    Partners: Male    Birth Control/ Protection: None   Other Topics Concern  . Not on file   Social History Narrative   Moved here from [place] last year for her husband's job.   3 adopted children ages 81, 65, 2.      Works at a daycare.     ROS: See HPI No SI or HI Appetite fair-weight stable Wt Readings from Last 3 Encounters:  07/08/13 136 lb (61.689 kg)  07/02/13 136 lb (61.689 kg)   06/01/13 145 lb (65.772 kg)     Objective:   Physical Exam   BP 112/66  Pulse 91  Temp(Src) 98 F (36.7 C) (Oral)  Ht 5' 2.25" (1.581 m)  Wt 136 lb (61.689 kg)  BMI 24.68 kg/m2  SpO2 98%  LMP 07/02/2013 Wt Readings from Last 3 Encounters:  07/08/13 136 lb (61.689 kg)  07/02/13 136 lb (61.689 kg)  06/01/13 145 lb (65.772 kg)    General: Appears her stated age, well developed, well nourished in NAD. Cardiovascular: Normal rate and rhythm. S1,S2 noted.  No murmur, rubs or gallops noted. No JVD or BLE edema. No carotid bruits noted. Pulmonary/Chest: Normal effort and positive vesicular breath sounds. No respiratory distress. No wheezes, rales or ronchi noted.  Neurological: Alert and oriented. Cranial nerves II-XII intact. Coordination normal. +DTRs bilaterally. Psychiatric: Mood and affect normal. Behavior is normal. Judgment and thought content normal.         Assessment & Plan:

## 2013-07-31 ENCOUNTER — Encounter: Payer: Self-pay | Admitting: Internal Medicine

## 2013-07-31 ENCOUNTER — Ambulatory Visit (INDEPENDENT_AMBULATORY_CARE_PROVIDER_SITE_OTHER): Payer: 59 | Admitting: Internal Medicine

## 2013-07-31 VITALS — BP 124/84 | HR 90 | Temp 98.2°F | Wt 134.0 lb

## 2013-07-31 DIAGNOSIS — J209 Acute bronchitis, unspecified: Secondary | ICD-10-CM

## 2013-07-31 MED ORDER — AZITHROMYCIN 250 MG PO TABS: ORAL_TABLET | ORAL | Status: DC

## 2013-07-31 MED ORDER — HYDROCODONE-HOMATROPINE 5-1.5 MG/5ML PO SYRP: 5.0000 mL | ORAL_SOLUTION | Freq: Three times a day (TID) | ORAL | Status: DC | PRN

## 2013-07-31 NOTE — Progress Notes (Signed)
Pre-visit discussion using our clinic review tool. No additional management support is needed unless otherwise documented below in the visit note.  

## 2013-07-31 NOTE — Patient Instructions (Addendum)
Acute Bronchitis Bronchitis is inflammation of the airways that extend from the windpipe into the lungs (bronchi). The inflammation often causes mucus to develop. This leads to a cough, which is the most common symptom of bronchitis.  In acute bronchitis, the condition usually develops suddenly and goes away over time, usually in a couple weeks. Smoking, allergies, and asthma can make bronchitis worse. Repeated episodes of bronchitis may cause further lung problems.  CAUSES Acute bronchitis is most often caused by the same virus that causes a cold. The virus can spread from person to person (contagious).  SIGNS AND SYMPTOMS   Cough.   Fever.   Coughing up mucus.   Body aches.   Chest congestion.   Chills.   Shortness of breath.   Sore throat.  DIAGNOSIS  Acute bronchitis is usually diagnosed through a physical exam. Tests, such as chest X-rays, are sometimes done to rule out other conditions.  TREATMENT  Acute bronchitis usually goes away in a couple weeks. Often times, no medical treatment is necessary. Medicines are sometimes given for relief of fever or cough. Antibiotics are usually not needed but may be prescribed in certain situations. In some cases, an inhaler may be recommended to help reduce shortness of breath and control the cough. A cool mist vaporizer may also be used to help thin bronchial secretions and make it easier to clear the chest.  HOME CARE INSTRUCTIONS  Get plenty of rest.   Drink enough fluids to keep your urine clear or pale yellow (unless you have a medical condition that requires fluid restriction). Increasing fluids may help thin your secretions and will prevent dehydration.   Only take over-the-counter or prescription medicines as directed by your health care provider.   Avoid smoking and secondhand smoke. Exposure to cigarette smoke or irritating chemicals will make bronchitis worse. If you are a smoker, consider using nicotine gum or skin  patches to help control withdrawal symptoms. Quitting smoking will help your lungs heal faster.   Reduce the chances of another bout of acute bronchitis by washing your hands frequently, avoiding people with cold symptoms, and trying not to touch your hands to your mouth, nose, or eyes.   Follow up with your health care provider as directed.  SEEK MEDICAL CARE IF: Your symptoms do not improve after 1 week of treatment.  SEEK IMMEDIATE MEDICAL CARE IF:  You develop an increased fever or chills.   You have chest pain.   You have severe shortness of breath.  You have bloody sputum.   You develop dehydration.  You develop fainting.  You develop repeated vomiting.  You develop a severe headache. MAKE SURE YOU:   Understand these instructions.  Will watch your condition.  Will get help right away if you are not doing well or get worse. Document Released: 07/12/2004 Document Revised: 02/04/2013 Document Reviewed: 11/25/2012 Exit[employer] Patient Information 2014 [employer].  

## 2013-07-31 NOTE — Progress Notes (Signed)
HPI  Pt presents to the clinic today with c/o cough, chest congestion and nasal congestion. She reports this started about 4 days ago. The cough is unproductive and occurs mostly at night. She is blowing thick yellow mucous out her nose. She has been running low grade fevers. She has taken OTC Sudafed without any relief. She has no history of seasonal allergies.  Review of Systems      Past Medical History  Diagnosis Date  . Depression     Family History  Problem Relation Age of Onset  . Cancer Mother 38    breast  . Cancer Father     colon  . Diabetes Father     History   Social History  . Marital Status: Married    Spouse Name: N/A    Number of Children: N/A  . Years of Education: N/A   Occupational History  . Not on file.   Social History Main Topics  . Smoking status: Never Smoker   . Smokeless tobacco: Never Used  . Alcohol Use: No  . Drug Use: No  . Sexual Activity: Yes    Partners: Male    Birth Control/ Protection: None   Other Topics Concern  . Not on file   Social History Narrative   Moved here from [place] last year for her husband's job.   3 adopted children ages 50, 59, 60.      Works at a daycare.    No Known Allergies   Constitutional: Positive headache, fatigue and fever. Denies abrupt weight changes.  HEENT:  Positive nasal congestion, sore throat. Denies eye redness, eye pain, pressure behind the eyes, facial pain, ear pain, ringing in the ears, wax buildup, runny nose or bloody nose. Respiratory: Positive cough. Denies difficulty breathing or shortness of breath.  Cardiovascular: Denies chest pain, chest tightness, palpitations or swelling in the hands or feet.   No other specific complaints in a complete review of systems (except as listed in HPI above).  Objective:   BP 124/84  Pulse 90  Temp(Src) 98.2 F (36.8 C) (Oral)  Wt 134 lb (60.782 kg)  SpO2 98%  LMP 07/02/2013 Wt Readings from Last 3 Encounters:  07/31/13 134 lb  (60.782 kg)  07/08/13 136 lb (61.689 kg)  07/02/13 136 lb (61.689 kg)     General: Appears her stated age, well developed, well nourished in NAD. HEENT: Head: normal shape and size; Eyes: sclera white, no icterus, conjunctiva pink, PERRLA and EOMs intact; Ears: Tm's gray and intact, normal light reflex; Nose: mucosa pink and moist, septum midline; Throat/Mouth: + PND. Teeth present, mucosa erythematous and moist, no exudate noted, no lesions or ulcerations noted.  Neck: Mild cervical lymphadenopathy. Neck supple, trachea midline. No massses, lumps or thyromegaly present.  Cardiovascular: Normal rate and rhythm. S1,S2 noted.  No murmur, rubs or gallops noted. No JVD or BLE edema. No carotid bruits noted. Pulmonary/Chest: Normal effort and scattered rhonchi throughout. No respiratory distress. No wheezes, rales or ronchi noted.      Assessment & Plan:   Acute Bronchitis:  Get some rest and drink plenty of water Do salt water gargles for the sore throat eRx for Azithromax x 5 days eRx for Hycodan cough syrup  RTC as needed or if symptoms persist.

## 2013-09-28 ENCOUNTER — Encounter: Payer: Self-pay | Admitting: Family Medicine

## 2013-09-29 MED ORDER — BUPROPION HCL ER (XL) 300 MG PO TB24: 300.0000 mg | ORAL_TABLET | Freq: Every day | ORAL | Status: DC

## 2014-01-04 NOTE — Telephone Encounter (Signed)
Pt responded to via mychart 

## 2014-01-20 ENCOUNTER — Ambulatory Visit (INDEPENDENT_AMBULATORY_CARE_PROVIDER_SITE_OTHER)
Admission: RE | Admit: 2014-01-20 | Discharge: 2014-01-20 | Disposition: A | Discharge status: Home / Self Care | Payer: 59 | Source: Ambulatory Visit | Attending: Family Medicine | Admitting: Family Medicine

## 2014-01-20 ENCOUNTER — Encounter: Payer: Self-pay | Admitting: Family Medicine

## 2014-01-20 ENCOUNTER — Ambulatory Visit (INDEPENDENT_AMBULATORY_CARE_PROVIDER_SITE_OTHER): Payer: 59 | Admitting: Family Medicine

## 2014-01-20 VITALS — BP 116/68 | HR 78 | Temp 98.4°F | Ht 62.25 in | Wt 143.2 lb

## 2014-01-20 DIAGNOSIS — M25559 Pain in unspecified hip: Secondary | ICD-10-CM

## 2014-01-20 DIAGNOSIS — M7061 Trochanteric bursitis, right hip: Secondary | ICD-10-CM

## 2014-01-20 DIAGNOSIS — F32A Depression, unspecified: Secondary | ICD-10-CM

## 2014-01-20 DIAGNOSIS — F329 Major depressive disorder, single episode, unspecified: Secondary | ICD-10-CM

## 2014-01-20 DIAGNOSIS — F411 Generalized anxiety disorder: Secondary | ICD-10-CM

## 2014-01-20 DIAGNOSIS — F3289 Other specified depressive episodes: Secondary | ICD-10-CM

## 2014-01-20 DIAGNOSIS — M25551 Pain in right hip: Secondary | ICD-10-CM

## 2014-01-20 DIAGNOSIS — M76899 Other specified enthesopathies of unspecified lower limb, excluding foot: Secondary | ICD-10-CM

## 2014-01-20 MED ORDER — BUPROPION HCL ER (XL) 300 MG PO TB24: 300.0000 mg | ORAL_TABLET | Freq: Every day | ORAL | Status: DC

## 2014-01-20 NOTE — Assessment & Plan Note (Signed)
Improved with wellbutrin. No longer taking xanax. eRx sent to mail order pharmacy.
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
## 2014-01-20 NOTE — Patient Instructions (Signed)
Great to see you. I will call you with your xray results. We may advise you to come back to see Dr. Lorelei Pont to have an injection of steroid into your hip.
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
## 2014-01-27 ENCOUNTER — Encounter: Payer: Self-pay | Admitting: Family Medicine

## 2014-01-27 ENCOUNTER — Ambulatory Visit (INDEPENDENT_AMBULATORY_CARE_PROVIDER_SITE_OTHER): Payer: 59 | Admitting: Family Medicine

## 2014-01-27 VITALS — BP 124/82 | HR 78 | Temp 98.2°F | Ht 62.25 in | Wt 143.0 lb

## 2014-01-27 DIAGNOSIS — M25551 Pain in right hip: Secondary | ICD-10-CM

## 2014-01-27 DIAGNOSIS — M25559 Pain in unspecified hip: Secondary | ICD-10-CM

## 2014-01-27 DIAGNOSIS — M7061 Trochanteric bursitis, right hip: Secondary | ICD-10-CM

## 2014-01-27 DIAGNOSIS — M76899 Other specified enthesopathies of unspecified lower limb, excluding foot: Secondary | ICD-10-CM
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
## 2014-01-27 NOTE — Progress Notes (Signed)
Pre visit review using our clinic review tool, if applicable. No additional management support is needed unless otherwise documented below in the visit note. 

## 2014-04-20 ENCOUNTER — Ambulatory Visit (INDEPENDENT_AMBULATORY_CARE_PROVIDER_SITE_OTHER): Payer: 59 | Admitting: Family Medicine

## 2014-04-20 ENCOUNTER — Encounter: Payer: Self-pay | Admitting: Family Medicine

## 2014-04-20 VITALS — BP 118/80 | HR 98 | Temp 98.0°F | Wt 143.2 lb

## 2014-04-20 DIAGNOSIS — F32A Depression, unspecified: Secondary | ICD-10-CM

## 2014-04-20 DIAGNOSIS — F329 Major depressive disorder, single episode, unspecified: Secondary | ICD-10-CM

## 2014-04-20 DIAGNOSIS — F411 Generalized anxiety disorder: Secondary | ICD-10-CM

## 2014-04-20 DIAGNOSIS — F418 Other specified anxiety disorders: Secondary | ICD-10-CM

## 2014-04-20 DIAGNOSIS — F419 Anxiety disorder, unspecified: Secondary | ICD-10-CM

## 2014-04-20 MED ORDER — ALPRAZOLAM 0.5 MG PO TABS: ORAL_TABLET | ORAL | Status: DC

## 2014-04-20 MED ORDER — BUPROPION HCL ER (XL) 300 MG PO TB24: 300.0000 mg | ORAL_TABLET | Freq: Every day | ORAL | Status: DC

## 2014-04-20 NOTE — Assessment & Plan Note (Signed)
>  25 minutes spent in face to face time with patient, >50% spent in counselling or coordination of care Deteriorated. Discussed tx options. She agrees that psychotherapy would be helpful but wants to check about insurance coverage first. Also restart prn xanax- she is aware of addiction and sedation potential. Call or return to clinic prn if these symptoms worsen or fail to improve as anticipated. The patient indicates understanding of these issues and agrees with the plan.

## 2014-04-20 NOTE — Progress Notes (Signed)
Pre visit review using our clinic review tool, if applicable. No additional management support is needed unless otherwise documented below in the visit note. 

## 2014-04-20 NOTE — Patient Instructions (Signed)
It was great to see you. Hang in there.  Use Xanax as needed like we discussed. Check with your insurance company concerning cover for psychotherapy and call me if you would like to see someone.

## 2014-04-20 NOTE — Progress Notes (Signed)
   Subjective:    Patient ID: Regina Salazar, female    DOB: [birth year], 43 y.o.   MRN: [id number]  HPI  43 yo pleasant female here for f/u anxiety and depression.  Taking Wellbutrin 300 mg XL daily. She reports that this was previously working well until this weekend.     Had Friday off- not used to being home alone- started to get anxious when she was alone about things that have been stressful lately, particularly financial stressors. Then went to church on Sunday- "every pew was full," felt claustrophobic and because very panicked and SOB.  Husband was going to take her to the ER but she felt these symptoms were similar to her previous panic attacks.    Previously took prn xanax at bedtime but weaned off as her anxiety improved. Also taking Melatonin 5 mg at bedtime.   Past Medical History  Diagnosis Date  . Depression     Current Outpatient Prescriptions  Medication Sig Dispense Refill  . buPROPion (WELLBUTRIN XL) 300 MG 24 hr tablet Take 1 tablet (300 mg total) by mouth daily. 90 tablet 3  . ibuprofen (ADVIL,MOTRIN) 200 MG tablet Take 400 mg by mouth as needed. headache    . ALPRAZolam (XANAX) 0.5 MG tablet 1/2 to 1 tablet up to three times as needed for panic attacks and insomnia 60 tablet 0   No current facility-administered medications for this visit.    No Known Allergies  Family History  Problem Relation Age of Onset  . Cancer Mother 61    breast  . Cancer Father     colon  . Diabetes Father     History   Social History  . Marital Status: Married    Spouse Name: N/A    Number of Children: N/A  . Years of Education: N/A   Occupational History  . Not on file.   Social History Main Topics  . Smoking status: Never Smoker   . Smokeless tobacco: Never Used  . Alcohol Use: No  . Drug Use: No  . Sexual Activity:    Partners: Male    Birth Control/ Protection: None   Other Topics Concern  . Not on file   Social History Narrative   Moved here from  [place] last year for her husband's job.   3 adopted children ages 73, 71, 20.      Works at a daycare.     ROS: See HPI No SI or HI Husband supportive but feels isolated since they do not have any family in the area- all live in [place].   Objective:   Physical Exam   BP 118/80 mmHg  Pulse 98  Temp(Src) 98 F (36.7 C) (Oral)  Wt 143 lb 4 oz (64.978 kg)  SpO2 99%  LMP 04/12/2014 Wt Readings from Last 3 Encounters:  04/20/14 143 lb 4 oz (64.978 kg)  01/27/14 143 lb (64.864 kg)  01/20/14 143 lb 4 oz (64.978 kg)    General: Appears her stated age, well developed, well nourished in NAD. Cardiovascular: Normal rate and rhythm. S1,S2 noted.  No murmur, rubs or gallops noted. No JVD or BLE edema. No carotid bruits noted. Pulmonary/Chest: Normal effort and positive vesicular breath sounds. No respiratory distress. No wheezes, rales or ronchi noted.  Neurological: Alert and oriented. Cranial nerves II-XII intact. Coordination normal. +DTRs bilaterally. Psychiatric:Tearful, moderately anxious       Assessment & Plan:

## 2014-05-11 ENCOUNTER — Ambulatory Visit (INDEPENDENT_AMBULATORY_CARE_PROVIDER_SITE_OTHER): Payer: 59 | Admitting: Internal Medicine

## 2014-05-11 ENCOUNTER — Encounter: Payer: Self-pay | Admitting: Internal Medicine

## 2014-05-11 VITALS — BP 120/74 | HR 97 | Temp 98.2°F | Wt 139.0 lb

## 2014-05-11 DIAGNOSIS — J9801 Acute bronchospasm: Secondary | ICD-10-CM

## 2014-05-11 DIAGNOSIS — R059 Cough, unspecified: Secondary | ICD-10-CM

## 2014-05-11 DIAGNOSIS — R05 Cough: Secondary | ICD-10-CM

## 2014-05-11 MED ORDER — PREDNISONE 10 MG PO TABS
ORAL_TABLET | ORAL | Status: DC
Start: 2014-05-11 — End: 2014-06-09

## 2014-05-11 MED ORDER — HYDROCODONE-HOMATROPINE 5-1.5 MG/5ML PO SYRP: 5.0000 mL | ORAL_SOLUTION | Freq: Three times a day (TID) | ORAL | Status: DC | PRN

## 2014-05-11 NOTE — Progress Notes (Signed)
HPI  Pt presents to the clinic today with c/o cough. She reports this started 4-5 days ago. The cough is worse at night. It is non productive. She reports feeling a heaviness in chest along with some mild shortness of breath. She denies fever, chills, body aches or chest pain. She has not tried anything OTC. She has no history of allergies or breathing problems. She has not had sick contacts. She does not smoke.  Review of Systems      Past Medical History  Diagnosis Date  . Depression     Family History  Problem Relation Age of Onset  . Cancer Mother 63    breast  . Cancer Father     colon  . Diabetes Father     History   Social History  . Marital Status: Married    Spouse Name: N/A    Number of Children: N/A  . Years of Education: N/A   Occupational History  . Not on file.   Social History Main Topics  . Smoking status: Never Smoker   . Smokeless tobacco: Never Used  . Alcohol Use: No  . Drug Use: No  . Sexual Activity:    Partners: Male    Birth Control/ Protection: None   Other Topics Concern  . Not on file   Social History Narrative   Moved here from [place] last year for her husband's job.   3 adopted children ages 79, 61, 9.      Works at a daycare.    No Known Allergies   Constitutional:  Denies headache, fatigue, fever or abrupt weight changes.  HEENT:  Positive sore throat. Denies eye redness, eye pain, pressure behind the eyes, facial pain, nasal congestion, ear pain, ringing in the ears, wax buildup, runny nose or bloody nose. Respiratory: Positive cough and shortness of breath. Denies difficulty breathing.  Cardiovascular: Denies chest pain, chest tightness, palpitations or swelling in the hands or feet.   No other specific complaints in a complete review of systems (except as listed in HPI above).  Objective:   BP 120/74 mmHg  Pulse 97  Temp(Src) 98.2 F (36.8 C) (Oral)  Wt 139 lb (63.05 kg)  SpO2 99%  LMP 04/12/2014 Wt Readings  from Last 3 Encounters:  05/11/14 139 lb (63.05 kg)  04/20/14 143 lb 4 oz (64.978 kg)  01/27/14 143 lb (64.864 kg)     General: Appears her stated age, well developed, well nourished in NAD. HEENT: Head: normal shape and size; Ears: Tm's gray and intact, normal light reflex; Nose: mucosa pink and moist, septum midline; Throat/Mouth:  Teeth present, mucosa erythematous and moist, no exudate noted, no lesions or ulcerations noted.  Cardiovascular: Normal rate and rhythm. S1,S2 noted.  No murmur, rubs or gallops noted.  Pulmonary/Chest: Normal effort and bilateral expiratory wheeze. No respiratory distress. No rales or ronchi noted.      Assessment & Plan:   Cough and bronchospasm without asthma or COPD:  Do salt water gargles for the sore throat eRx for pred taper for inflammation Rx for Hycodan cough syrup  If worse or no improvement by Friday, call back, will call in zpack  RTC as needed or if symptoms persist.

## 2014-05-11 NOTE — Patient Instructions (Signed)
Cough, Adult  A cough is a reflex that helps clear your throat and airways. It can help heal the body or may be a reaction to an irritated airway. A cough may only last 2 or 3 weeks (acute) or may last more than 8 weeks (chronic).  CAUSES Acute cough:  Viral or bacterial infections. Chronic cough:  Infections.  Allergies.  Asthma.  Post-nasal drip.  Smoking.  Heartburn or acid reflux.  Some medicines.  Chronic lung problems (COPD).  Cancer. SYMPTOMS   Cough.  Fever.  Chest pain.  Increased breathing rate.  High-pitched whistling sound when breathing (wheezing).  Colored mucus that you cough up (sputum). TREATMENT   A bacterial cough may be treated with antibiotic medicine.  A viral cough must run its course and will not respond to antibiotics.  Your caregiver may recommend other treatments if you have a chronic cough. HOME CARE INSTRUCTIONS   Only take over-the-counter or prescription medicines for pain, discomfort, or fever as directed by your caregiver. Use cough suppressants only as directed by your caregiver.  Use a cold steam vaporizer or humidifier in your bedroom or home to help loosen secretions.  Sleep in a semi-upright position if your cough is worse at night.  Rest as needed.  Stop smoking if you smoke. SEEK IMMEDIATE MEDICAL CARE IF:   You have pus in your sputum.  Your cough starts to worsen.  You cannot control your cough with suppressants and are losing sleep.  You begin coughing up blood.  You have difficulty breathing.  You develop pain which is getting worse or is uncontrolled with medicine.  You have a fever. MAKE SURE YOU:   Understand these instructions.  Will watch your condition.  Will get help right away if you are not doing well or get worse. Document Released: 12/01/2010 Document Revised: 08/27/2011 Document Reviewed: 12/01/2010 ExitCare Patient Information 2015 [employer]. This information is not intended  to replace advice given to you by your health care provider. Make sure you discuss any questions you have with your health care provider.  

## 2014-05-11 NOTE — Progress Notes (Signed)
Pre visit review using our clinic review tool, if applicable. No additional management support is needed unless otherwise documented below in the visit note. 

## 2014-06-09 ENCOUNTER — Encounter: Payer: Self-pay | Admitting: Family Medicine

## 2014-06-09 ENCOUNTER — Ambulatory Visit (INDEPENDENT_AMBULATORY_CARE_PROVIDER_SITE_OTHER): Payer: 59 | Admitting: Family Medicine

## 2014-06-09 VITALS — BP 122/80 | HR 87 | Temp 98.0°F | Wt 139.2 lb

## 2014-06-09 DIAGNOSIS — H109 Unspecified conjunctivitis: Secondary | ICD-10-CM

## 2014-06-09 MED ORDER — TOBRAMYCIN 0.3 % OP SOLN: 2.0000 [drp] | OPHTHALMIC | Status: DC

## 2014-06-09 NOTE — Progress Notes (Signed)
SUBJECTIVE:  43 y.o. female with burning, redness, discharge and mattering in right eye for 2 days.  No other symptoms.  No significant prior ophthalmological history. No change in visual acuity, no photophobia, no severe eye pain.  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  She has no known drug allergies but her mom was allergic to sulfa. Current Outpatient Prescriptions on File Prior to Visit  Medication Sig Dispense Refill  . ALPRAZolam (XANAX) 0.5 MG tablet 1/2 to 1 tablet up to three times as needed for panic attacks and insomnia 60 tablet 0  . buPROPion (WELLBUTRIN XL) 300 MG 24 hr tablet Take 1 tablet (300 mg total) by mouth daily. 90 tablet 3   No current facility-administered medications on file prior to visit.    Allergies  Allergen Reactions  . Prednisone Anxiety    Induces her anxiety    Past Medical History  Diagnosis Date  . Depression     Past Surgical History  Procedure Laterality Date  . Cystectomy on writst  1980  . Examination under anesthesia  06/05/2012    Procedure: EXAM UNDER ANESTHESIA;  Surgeon: Donnamae Jude, MD;  Location: [facility];  Service: Gynecology;  Laterality: N/A;  pap testing    Family History  Problem Relation Age of Onset  . Cancer Mother 56    breast  . Cancer Father     colon  . Diabetes Father     History   Social History  . Marital Status: Married    Spouse Name: N/A    Number of Children: N/A  . Years of Education: N/A   Occupational History  . Not on file.   Social History Main Topics  . Smoking status: Never Smoker   . Smokeless tobacco: Never Used  . Alcohol Use: No  . Drug Use: No  . Sexual Activity:    Partners: Male    Birth Control/ Protection: None   Other Topics Concern  . Not on file   Social History Narrative   Moved here from [place] last year for her husband's job.   3 adopted children ages 10, 1, 36.      Works at a daycare.    OBJECTIVE:  BP 122/80 mmHg  Pulse 87  Temp(Src) 98 F  (36.7 C) (Oral)  Wt 139 lb 3 oz (63.135 kg)  SpO2 97%  LMP 06/07/2014  Patient appears well, vitals signs are normal. Eyes: right eye with findings of typical conjunctivitis noted; erythema and discharge. PERRLA, no foreign body noted. No periorbital cellulitis. The corneas are clear and fundi normal. Visual acuity normal.   ASSESSMENT:  Conjunctivitis - probably bacterial  PLAN:  Antibiotic drops per order. Hygiene discussed. If other family members develop same condition, may use same medication for them if they are not known to be allergic to it. Call prn.

## 2014-06-09 NOTE — Progress Notes (Signed)
Pre visit review using our clinic review tool, if applicable. No additional management support is needed unless otherwise documented below in the visit note. 

## 2014-06-09 NOTE — Patient Instructions (Signed)

## 2014-09-24 ENCOUNTER — Ambulatory Visit: Payer: Self-pay | Admitting: Internal Medicine

## 2014-09-28 ENCOUNTER — Ambulatory Visit (INDEPENDENT_AMBULATORY_CARE_PROVIDER_SITE_OTHER): Payer: 59 | Admitting: Family Medicine

## 2014-09-28 ENCOUNTER — Encounter: Payer: Self-pay | Admitting: Family Medicine

## 2014-09-28 VITALS — BP 126/76 | HR 78 | Temp 98.1°F | Wt 146.5 lb

## 2014-09-28 DIAGNOSIS — Z111 Encounter for screening for respiratory tuberculosis: Secondary | ICD-10-CM

## 2014-09-28 DIAGNOSIS — Z0289 Encounter for other administrative examinations: Secondary | ICD-10-CM

## 2014-09-28 NOTE — Progress Notes (Signed)
Subjective:   Patient ID: Regina Salazar, female    DOB: Nov 12, 1970, 44 y.o.   MRN: [id number]  Regina Salazar is a pleasant 44 y.o. 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 Says she can feel her anxiety increase when the manager walks in the room.  Got a new job at [employer] center in [place].  She is very excited about this position.  Brings in a form stating that she needs a TB test and asking me questions about her physical and emotional wellbeing in relation to taking care of children.    Current Outpatient Prescriptions on File Prior to Visit  Medication Sig Dispense Refill  . ALPRAZolam (XANAX) 0.5 MG tablet 1/2 to 1 tablet up to three times as needed for panic attacks and insomnia 60 tablet 0  . buPROPion (WELLBUTRIN XL) 300 MG 24 hr tablet Take 1 tablet (300 mg total) by mouth daily. 90 tablet 3  . tobramycin (TOBREX) 0.3 % ophthalmic solution Place 2 drops into both eyes every 4 (four) hours. 5 mL 0   No current facility-administered medications on file prior to visit.    Allergies  Allergen Reactions  . Prednisone Anxiety    Induces her anxiety    Past Medical History  Diagnosis Date  . Depression     Past Surgical History  Procedure Laterality Date  . Cystectomy on writst  1980  . Examination under anesthesia  06/05/2012    Procedure: EXAM UNDER ANESTHESIA;  Surgeon: Donnamae Jude, MD;  Location: [facility];  Service: Gynecology;  Laterality: N/A;  pap testing    Family History  Problem Relation Age of Onset  . Cancer Mother 28    breast  . Cancer Father     colon  . Diabetes Father     History   Social History  . Marital Status: Married    Spouse Name: N/A  . Number of Children: N/A  . Years of Education: N/A   Occupational History  . Not on file.   Social History Main Topics  . Smoking status:  Never Smoker   . Smokeless tobacco: Never Used  . Alcohol Use: No  . Drug Use: No  . Sexual Activity:    Partners: Male    Birth Control/ Protection: None   Other Topics Concern  . Not on file   Social History Narrative   Moved here from [place] last year for her husband's job.   3 adopted children ages 61, 13, 45.      Works at a daycare.   The PMH, PSH, Social History, Family History, Medications, and allergies have been reviewed in [facility], and have been updated if relevant.   Review of Systems  Constitutional: Negative.   Respiratory: Negative.   Cardiovascular: Negative.   Endocrine: Negative.   Psychiatric/Behavioral: Negative.   All other systems reviewed and are negative.      Objective:    BP 126/76 mmHg  Pulse 78  Temp(Src) 98.1 F (36.7 C) (Oral)  Wt 146 lb 8 oz (66.452 kg)  SpO2 95%  LMP 09/20/2014   Physical Exam  Constitutional: She is oriented to person, place, and time. She appears well-developed and well-nourished. No distress.  HENT:  Head: Normocephalic.  Eyes: Conjunctivae are normal.  Cardiovascular: Normal rate.   Pulmonary/Chest: Effort normal.  Neurological: She  is alert and oriented to person, place, and time. No cranial nerve deficit.  Skin: Skin is warm and dry.  Psychiatric: She has a normal mood and affect. Her behavior is normal. Judgment and thought content normal.  Nursing note and vitals reviewed.         Assessment & Plan:   Encounter for completion of form with patient No Follow-up on file.

## 2014-09-28 NOTE — Assessment & Plan Note (Signed)
Form completed, TB test given. She will return for TB test to be read in 72 hours. The patient indicates understanding of these issues and agrees with the plan.

## 2014-09-28 NOTE — Addendum Note (Signed)
Addended by: Modena Nunnery on: 09/28/2014 12:16 PM   Modules accepted: Orders

## 2014-09-28 NOTE — Progress Notes (Signed)
Pre visit review using our clinic review tool, if applicable. No additional management support is needed unless otherwise documented below in the visit note. 

## 2014-09-30 LAB — TB SKIN TEST
INDURATION: 0 mm
TB Skin Test: NEGATIVE

## 2015-04-19 ENCOUNTER — Other Ambulatory Visit: Payer: Self-pay | Admitting: Family Medicine

## 2015-04-19 NOTE — Telephone Encounter (Signed)
Lm on pts vm and informed her OV required for additional refills 

## 2015-06-08 ENCOUNTER — Encounter: Payer: Self-pay | Admitting: Family Medicine

## 2015-06-08 ENCOUNTER — Ambulatory Visit (INDEPENDENT_AMBULATORY_CARE_PROVIDER_SITE_OTHER): Payer: 59 | Admitting: Family Medicine

## 2015-06-08 VITALS — BP 128/80 | HR 74 | Temp 98.3°F | Wt 152.0 lb

## 2015-06-08 DIAGNOSIS — F418 Other specified anxiety disorders: Secondary | ICD-10-CM | POA: Diagnosis not present

## 2015-06-08 DIAGNOSIS — F411 Generalized anxiety disorder: Secondary | ICD-10-CM | POA: Insufficient documentation

## 2015-06-08 DIAGNOSIS — F419 Anxiety disorder, unspecified: Secondary | ICD-10-CM

## 2015-06-08 DIAGNOSIS — F329 Major depressive disorder, single episode, unspecified: Secondary | ICD-10-CM

## 2015-06-08 DIAGNOSIS — F32A Depression, unspecified: Secondary | ICD-10-CM

## 2015-06-08 MED ORDER — ALPRAZOLAM 0.5 MG PO TABS
ORAL_TABLET | ORAL | Status: DC
Start: 2015-06-08 — End: 2016-09-25

## 2015-06-08 MED ORDER — BUSPIRONE HCL 15 MG PO TABS: 7.5000 mg | ORAL_TABLET | Freq: Two times a day (BID) | ORAL | Status: DC

## 2015-06-08 NOTE — Progress Notes (Signed)
Pre visit review using our clinic review tool, if applicable. No additional management support is needed unless otherwise documented below in the visit note. 

## 2015-06-08 NOTE — Progress Notes (Signed)
   Subjective:    Patient ID: Regina Salazar, female    DOB: July 23, 1970, 44 y.o.   MRN: [id number]  HPI  44 yo pleasant female here for f/u anxiety and depression.  Taking Wellbutrin 300 mg XL daily. She reports that this was previously working well until the past few weeks.  Having more panic attacks.  Feels overall more anxious.  A little more tearful- no SI or HI.  More stressors at work- company was bought out by another company.     Past Medical History  Diagnosis Date  . Depression     Current Outpatient Prescriptions  Medication Sig Dispense Refill  . ALPRAZolam (XANAX) 0.5 MG tablet 1/2 to 1 tablet up to three times as needed for panic attacks and insomnia 60 tablet 0  . buPROPion (WELLBUTRIN XL) 300 MG 24 hr tablet Take 1 tablet (300 mg total) by mouth daily. OFFICE VISIT REQUIRED FOR ADDITIONAL REFILLS 90 tablet 0   No current facility-administered medications for this visit.    Allergies  Allergen Reactions  . Prednisone Anxiety    Induces her anxiety    Family History  Problem Relation Age of Onset  . Cancer Mother 69    breast  . Cancer Father     colon  . Diabetes Father     Social History   Social History  . Marital Status: Married    Spouse Name: N/A  . Number of Children: N/A  . Years of Education: N/A   Occupational History  . Not on file.   Social History Main Topics  . Smoking status: Never Smoker   . Smokeless tobacco: Never Used  . Alcohol Use: No  . Drug Use: No  . Sexual Activity:    Partners: Male    Birth Control/ Protection: None   Other Topics Concern  . Not on file   Social History Narrative   Moved here from [place] last year for her husband's job.   3 adopted children ages 32, 81, 17.      Works at a daycare.     ROS: See HPI No SI or HI Husband supportive    Objective:   Physical Exam   BP 128/80 mmHg  Pulse 74  Temp(Src) 98.3 F (36.8 C) (Oral)  Wt 152 lb (68.947 kg)  SpO2 98% Wt Readings  from Last 3 Encounters:  06/08/15 152 lb (68.947 kg)  09/28/14 146 lb 8 oz (66.452 kg)  06/09/14 139 lb 3 oz (63.135 kg)    General: Appears her stated age, well developed, well nourished in NAD. Cardiovascular: Normal rate and rhythm. S1,S2 noted.  No murmur, rubs or gallops noted. No JVD or BLE edema. No carotid bruits noted. Pulmonary/Chest: Normal effort and positive vesicular breath sounds. No respiratory distress. No wheezes, rales or ronchi noted.  Neurological: Alert and oriented. Cranial nerves II-XII intact. Coordination normal. +DTRs bilaterally. Psychiatric:Tearful, moderately anxious       Assessment & Plan:

## 2015-06-08 NOTE — Patient Instructions (Signed)
Great to see you. Happy Holidays.  We are adding Buspar 15 mg - 1/2 tablet (7.5 mg) twice daily.  You can increase to a full tablet twice daily if you tolerate this well.  Keep me updated.

## 2015-06-08 NOTE — Assessment & Plan Note (Addendum)
Deteriorated. >25 minutes spent in face to face time with patient, >50% spent in counselling or coordination of care Add Buspar 7.5 mg twice daily, may increase to 15 mg twice daily if tolerated well. She will keep me updated.

## 2015-06-20 ENCOUNTER — Encounter: Payer: Self-pay | Admitting: Family Medicine

## 2015-06-21 ENCOUNTER — Other Ambulatory Visit: Payer: Self-pay | Admitting: Family Medicine

## 2015-06-21 MED ORDER — BUPROPION HCL ER (XL) 300 MG PO TB24: 300.0000 mg | ORAL_TABLET | Freq: Every day | ORAL | Status: DC

## 2015-06-30 ENCOUNTER — Encounter: Payer: Self-pay | Admitting: Family Medicine

## 2015-07-04 ENCOUNTER — Other Ambulatory Visit: Payer: Self-pay | Admitting: *Deleted

## 2015-07-04 MED ORDER — BUSPIRONE HCL 15 MG PO TABS: 7.5000 mg | ORAL_TABLET | Freq: Two times a day (BID) | ORAL | Status: DC

## 2016-03-11 ENCOUNTER — Other Ambulatory Visit: Payer: Self-pay | Admitting: Family Medicine

## 2016-03-12 NOTE — Telephone Encounter (Signed)
Last refill 06/21/15 #90 +3, last OV 06/08/15. Ok to refill/

## 2016-06-10 ENCOUNTER — Other Ambulatory Visit: Payer: Self-pay | Admitting: Family Medicine

## 2016-06-12 ENCOUNTER — Encounter: Payer: Self-pay | Admitting: *Deleted

## 2016-08-12 ENCOUNTER — Other Ambulatory Visit: Payer: Self-pay | Admitting: Family Medicine

## 2016-09-08 ENCOUNTER — Other Ambulatory Visit: Payer: Self-pay | Admitting: Family Medicine

## 2016-09-25 ENCOUNTER — Encounter: Payer: Self-pay | Admitting: Family Medicine

## 2016-09-25 ENCOUNTER — Ambulatory Visit (INDEPENDENT_AMBULATORY_CARE_PROVIDER_SITE_OTHER): Payer: 59 | Admitting: Family Medicine

## 2016-09-25 VITALS — BP 128/82 | HR 92 | Wt 151.8 lb

## 2016-09-25 DIAGNOSIS — F418 Other specified anxiety disorders: Secondary | ICD-10-CM

## 2016-09-25 DIAGNOSIS — F32A Depression, unspecified: Secondary | ICD-10-CM

## 2016-09-25 DIAGNOSIS — F329 Major depressive disorder, single episode, unspecified: Secondary | ICD-10-CM

## 2016-09-25 DIAGNOSIS — F339 Major depressive disorder, recurrent, unspecified: Secondary | ICD-10-CM | POA: Diagnosis not present

## 2016-09-25 DIAGNOSIS — F411 Generalized anxiety disorder: Secondary | ICD-10-CM | POA: Diagnosis not present

## 2016-09-25 DIAGNOSIS — F419 Anxiety disorder, unspecified: Secondary | ICD-10-CM

## 2016-09-25 MED ORDER — BUPROPION HCL ER (XL) 300 MG PO TB24: 300.0000 mg | ORAL_TABLET | Freq: Every day | ORAL | 3 refills | Status: DC

## 2016-09-25 MED ORDER — ALPRAZOLAM 0.5 MG PO TABS: ORAL_TABLET | ORAL | 0 refills | Status: DC

## 2016-09-25 MED ORDER — BUSPIRONE HCL 15 MG PO TABS: ORAL_TABLET | ORAL | 3 refills | Status: DC

## 2016-09-25 NOTE — Assessment & Plan Note (Signed)
Deteriorated. Increase buspar to 15 mg twice daily, continue wellbutrin at current dose and xanax as needed. Call or return to clinic prn if these symptoms worsen or fail to improve as anticipated. The patient indicates understanding of these issues and agrees with the plan.

## 2016-09-25 NOTE — Progress Notes (Signed)
Subjective:   Patient ID: Regina Salazar, female    DOB: 01-12-1971, 46 y.o.   MRN: [id number]  Regina Salazar is a pleasant 46 y.o. year old female who presents to clinic today with Medication Management  on 09/25/2016  HPI:  Anxiety/depression- currently taking Buspar 7.5 mg twice daily, wellbutrin 300 mg XL daily and as needed xanax.  She does feel the buspar isn't working quite as well as it did when she first started it.  Has been having more panic attacks although she uses xanax very sparingly.   No current outpatient prescriptions on file prior to visit.   No current facility-administered medications on file prior to visit.     Allergies  Allergen Reactions  . Prednisone Anxiety    Induces her anxiety    Past Medical History:  Diagnosis Date  . Depression     Past Surgical History:  Procedure Laterality Date  . cystectomy on writst  1980  . EXAMINATION UNDER ANESTHESIA  06/05/2012   Procedure: EXAM UNDER ANESTHESIA;  Surgeon: Donnamae Jude, MD;  Location: [facility];  Service: Gynecology;  Laterality: N/A;  pap testing    Family History  Problem Relation Age of Onset  . Cancer Mother 2    breast  . Cancer Father     colon  . Diabetes Father     Social History   Social History  . Marital status: Married    Spouse name: N/A  . Number of children: N/A  . Years of education: N/A   Occupational History  . Not on file.   Social History Main Topics  . Smoking status: Never Smoker  . Smokeless tobacco: Never Used  . Alcohol use No  . Drug use: No  . Sexual activity: Yes    Partners: Male    Birth control/ protection: None   Other Topics Concern  . Not on file   Social History Narrative   Moved here from [place] last year for her husband's job.   3 adopted children ages 108, 29, 74.      Works at a daycare.   The PMH, PSH, Social History, Family History, Medications, and allergies have been reviewed in [facility], and have been updated if  relevant.   Review of Systems  Psychiatric/Behavioral: Negative for agitation, behavioral problems, confusion, decreased concentration, dysphoric mood, hallucinations, self-injury, sleep disturbance and suicidal ideas. The patient is nervous/anxious. The patient is not hyperactive.   All other systems reviewed and are negative.      Objective:    BP 128/82   Pulse 92   Wt 151 lb 12 oz (68.8 kg)   SpO2 98%   BMI 27.53 kg/m    Physical Exam  Constitutional: She is oriented to person, place, and time. She appears well-developed and well-nourished. No distress.  HENT:  Head: Normocephalic and atraumatic.  Eyes: Conjunctivae are normal.  Cardiovascular: Normal rate.   Pulmonary/Chest: Effort normal.  Musculoskeletal: Normal range of motion.  Neurological: She is alert and oriented to person, place, and time. No cranial nerve deficit.  Skin: Skin is warm and dry. She is not diaphoretic.  Psychiatric: She has a normal mood and affect. Her behavior is normal. Judgment and thought content normal.  Nursing note and vitals reviewed.         Assessment & Plan:   Recurrent major depressive disorder, remission status unspecified (HCC)  Generalized anxiety disorder  Anxiety and depression - Plan: ALPRAZolam (XANAX) 0.5 MG tablet No Follow-up on file.

## 2017-03-18 ENCOUNTER — Encounter: Payer: Self-pay | Admitting: Family Medicine

## 2017-03-18 ENCOUNTER — Ambulatory Visit (INDEPENDENT_AMBULATORY_CARE_PROVIDER_SITE_OTHER): Payer: 59 | Admitting: Family Medicine

## 2017-03-18 VITALS — BP 130/90 | HR 78 | Temp 98.0°F | Wt 151.2 lb

## 2017-03-18 DIAGNOSIS — M7712 Lateral epicondylitis, left elbow: Secondary | ICD-10-CM

## 2017-03-18 NOTE — Progress Notes (Signed)
SUBJECTIVE: Regina Salazar is a 46 y.o. female who complains of  left elbow pain for 3 week(s) ago. 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  Symptoms have been constant since that time. Prior history of related problems: no prior problems with this area in the past.  Current Outpatient Prescriptions on File Prior to Visit  Medication Sig Dispense Refill  . ALPRAZolam (XANAX) 0.5 MG tablet 1/2 to 1 tablet up to three times as needed for panic attacks and insomnia 60 tablet 0  . buPROPion (WELLBUTRIN XL) 300 MG 24 hr tablet Take 1 tablet (300 mg total) by mouth daily. 90 tablet 3  . busPIRone (BUSPAR) 15 MG tablet 1 tablet twice daily 180 tablet 3   No current facility-administered medications on file prior to visit.     Allergies  Allergen Reactions  . Prednisone Anxiety    Induces her anxiety    Past Medical History:  Diagnosis Date  . Depression     Past Surgical History:  Procedure Laterality Date  . cystectomy on writst  1980  . EXAMINATION UNDER ANESTHESIA  06/05/2012   Procedure: EXAM UNDER ANESTHESIA;  Surgeon: Donnamae Jude, MD;  Location: [facility];  Service: Gynecology;  Laterality: N/A;  pap testing    Family History  Problem Relation Age of Onset  . Cancer Mother 51       breast  . Cancer Father        colon  . Diabetes Father     Social History   Social History  . Marital status: Married    Spouse name: N/A  . Number of children: N/A  . Years of education: N/A   Occupational History  . Not on file.   Social History Main Topics  . Smoking status: Never Smoker  . Smokeless tobacco: Never Used  . Alcohol use No  . Drug use: No  . Sexual activity: Yes    Partners: Male    Birth control/ protection: None   Other Topics Concern  . Not on file   Social History Narrative   Moved here from [place] last year for her husband's job.   3 adopted children ages 86, 43, 67.      Works at a daycare.   The PMH,  PSH, Social History, Family History, Medications, and allergies have been reviewed in [facility], and have been updated if relevant.  OBJECTIVE: BP 130/90 (BP Location: Left Arm, Patient Position: Sitting, Cuff Size: Normal)   Pulse 78   Temp 98 F (36.7 C) (Oral)   Wt 151 lb 4 oz (68.6 kg)   SpO2 97%   BMI 27.44 kg/m   Vital signs as noted above. Appearance: alert, well appearing, and in no distress and oriented to person, place, and time. Elbow exam: lateral epicondylar tenderness, remainder of elbow exam is normal, ipsilateral shoulder, wrist and hand exam is normal, contralateral elbow exam is normal. X-ray: not indicated.  ASSESSMENT: Tennis elbow  PLAN: rest the injured area as much as practical, use a sling See orders for this visit as documented in the electronic medical record.

## 2017-03-18 NOTE — Patient Instructions (Signed)
Tennis Elbow Tennis elbow (lateral epicondylitis) is inflammation of the outer tendons of your forearm close to your elbow. Your tendons attach your muscles to your bones. The outer tendons of your forearm are used to extend your wrist, and they attach on the outside part of your elbow. Tennis elbow is often found in people who play tennis, but anyone may get the condition from repeatedly extending the wrist or turning the forearm. What are the causes? This condition is caused by repeatedly extending your wrist and using your hands. It can result from sports or work that requires repetitive forearm movements. Tennis elbow may also be caused by an injury. What increases the risk? You have a higher risk of developing tennis elbow if you play tennis or another racquet sport. You also have a higher risk if you frequently use your hands for work. This condition is also more likely to develop in:  Musicians.  Carpenters, painters, and plumbers.  Cooks.  Cashiers.  People who work in [employer].  Architect workers.  Butchers.  People who use computers.  What are the signs or symptoms? Symptoms of this condition include:  Pain and tenderness in your forearm and the outer part of your elbow. You may only feel the pain when you use your arm, or you may feel it even when you are not using your arm.  A burning feeling that runs from your elbow through your arm.  Weak grip in your hands.    How is this treated? Your health care provider will recommend lifestyle adjustments, such as resting and icing your arm. Treatment may also include:  Medicines for inflammation. This may include shots of cortisone if your pain continues.  Physical therapy. This may include massage or exercises.  An elbow brace/strap.  Surgery may eventually be recommended if your pain does not go away with treatment. Follow these instructions at home: Activity  Rest your elbow and wrist as directed by your  health care provider. Try to avoid any activities that caused the problem until your health care provider says that you can do them again.  If a physical therapist teaches you exercises, do all of them as directed.  If you lift an object, lift it with your palm facing upward. This lowers the stress on your elbow. Lifestyle  If your tennis elbow is caused by sports, check your equipment and make sure that: ? You are using it correctly. ? It is the best fit for you.  If your tennis elbow is caused by work, take breaks frequently, if you are able. Talk with your manager about how to best perform tasks in a way that is safe. ? If your tennis elbow is caused by computer use, talk with your manager about any changes that can be made to your work environment. General instructions  If directed, apply ice to the painful area: ? Put ice in a plastic bag. ? Place a towel between your skin and the bag. ? Leave the ice on for 20 minutes, 2-3 times per day.  Take medicines only as directed by your health care provider.  If you were given a brace, wear it as directed by your health care provider.  Keep all follow-up visits as directed by your health care provider. This is important. Contact a health care provider if:  Your pain does not get better with treatment.  Your pain gets worse.  You have numbness or weakness in your forearm, hand, or fingers. This information is not  intended to replace advice given to you by your health care provider. Make sure you discuss any questions you have with your health care provider. Document Released: 06/04/2005 Document Revised: 02/02/2016 Document Reviewed: 05/31/2014 Elsevier Interactive Patient Education  [employer].

## 2017-08-15 ENCOUNTER — Encounter: Payer: Self-pay | Admitting: Internal Medicine

## 2017-08-15 ENCOUNTER — Ambulatory Visit (INDEPENDENT_AMBULATORY_CARE_PROVIDER_SITE_OTHER): Payer: 59 | Admitting: Internal Medicine

## 2017-08-15 VITALS — BP 126/82 | HR 76 | Temp 98.3°F | Wt 160.0 lb

## 2017-08-15 DIAGNOSIS — F329 Major depressive disorder, single episode, unspecified: Secondary | ICD-10-CM | POA: Diagnosis not present

## 2017-08-15 DIAGNOSIS — F419 Anxiety disorder, unspecified: Secondary | ICD-10-CM | POA: Diagnosis not present

## 2017-08-15 DIAGNOSIS — R2242 Localized swelling, mass and lump, left lower limb: Secondary | ICD-10-CM | POA: Diagnosis not present

## 2017-08-15 MED ORDER — BUPROPION HCL ER (XL) 150 MG PO TB24: 150.0000 mg | ORAL_TABLET | Freq: Every day | ORAL | 3 refills | Status: DC

## 2017-08-15 NOTE — Progress Notes (Signed)
HPI  Pt presents to the clinic today to establish care and for management of the conditions listed below. She is transferring care from Dr. Deborra Medina.  Anxiety and Depression: Triggered by life stress, change of routine. She is not taking Wellbutrin and Buspar as prescribed. She takes Xanax just as needed. She reports she only get 1 refill per year.  She also reports a cyst on the bottom of her left foot. She noticed this a few weeks ago. It is painful but has no grown in size. Ambulation makes it worse. She has not tried anything OTC for her symptoms.   Flu: never Tetanus: 02/2012 Pap Smear: 05/2012 Mammogram: 03/2012 Vision Screening: annually Dentist: biannuallly  Past Medical History:  Diagnosis Date  . Depression     Current Outpatient Medications  Medication Sig Dispense Refill  . ALPRAZolam (XANAX) 0.5 MG tablet 1/2 to 1 tablet up to three times as needed for panic attacks and insomnia 60 tablet 0  . buPROPion (WELLBUTRIN XL) 300 MG 24 hr tablet Take 1 tablet (300 mg total) by mouth daily. 90 tablet 3  . busPIRone (BUSPAR) 15 MG tablet 1 tablet twice daily 180 tablet 3   No current facility-administered medications for this visit.     Allergies  Allergen Reactions  . Prednisone Anxiety    Induces her anxiety    Family History  Problem Relation Age of Onset  . Cancer Mother 7       breast  . Cancer Father        colon  . Diabetes Father     Social History   Socioeconomic History  . Marital status: Married    Spouse name: Not on file  . Number of children: Not on file  . Years of education: Not on file  . Highest education level: Not on file  Social Needs  . Financial resource strain: Not on file  . Food insecurity - worry: Not on file  . Food insecurity - inability: Not on file  . Transportation needs - medical: Not on file  . Transportation needs - non-medical: Not on file  Occupational History  . Not on file  Tobacco Use  . Smoking status: Never Smoker   . Smokeless tobacco: Never Used  Substance and Sexual Activity  . Alcohol use: No  . Drug use: No  . Sexual activity: Yes    Partners: Male    Birth control/protection: None  Other Topics Concern  . Not on file  Social History Narrative   Moved here from [place] last year for her husband's job.   3 adopted children ages 29, 39, 69.      Works at a daycare.    ROS:  Constitutional: Denies fever, malaise, fatigue, headache or abrupt weight changes.  Skin: Pt reports mass of left foot. Denies redness, rashes, or ulcercations.  Neurological: Denies dizziness, difficulty with memory, difficulty with speech or problems with balance and coordination.  Psych: Pt reports anxiety and depression. Denies SI/HI.  No other specific complaints in a complete review of systems (except as listed in HPI above).  PE: BP 126/82   Pulse 76   Temp 98.3 F (36.8 C) (Oral)   Wt 160 lb (72.6 kg)   LMP 07/22/2017   SpO2 99%   BMI 29.03 kg/m   Wt Readings from Last 3 Encounters:  03/18/17 151 lb 4 oz (68.6 kg)  09/25/16 151 lb 12 oz (68.8 kg)  06/08/15 152 lb (68.9 kg)    General:  Appears her stated age, well developed, well nourished in NAD. Cardiovascular: Normal rate and rhythm. Pulmonary/Chest: Normal effort and positive vesicular breath sounds. No respiratory distress. No wheezes, rales or ronchi noted.  Skin: Pea sized hard lesion noted on the plantar surface of mid right foot.  Neurological: Alert and oriented. Cranial nerves II-XII grossly intact. Coordination normal.  Psychiatric: She is obviously anxious, and tearful at times.   BMET    Component Value Date/Time   NA 136 04/29/2012 0851   K 3.8 04/29/2012 0851   CL 105 04/29/2012 0851   CO2 20 04/29/2012 0851   GLUCOSE 106 (H) 04/29/2012 0851   BUN 16 04/29/2012 0851   CREATININE 0.7 04/29/2012 0851   CALCIUM 8.6 04/29/2012 0851    Lipid Panel     Component Value Date/Time   CHOL 203 (H) 04/29/2012 0851   TRIG 88.0  04/29/2012 0851   HDL 37.70 (L) 04/29/2012 0851   CHOLHDL 5 04/29/2012 0851   VLDL 17.6 04/29/2012 0851    CBC    Component Value Date/Time   WBC 7.6 06/05/2012 0931   RBC 4.81 06/05/2012 0931   HGB 14.8 06/05/2012 0931   HCT 43.2 06/05/2012 0931   PLT 316 06/05/2012 0931   MCV 89.8 06/05/2012 0931   MCH 30.8 06/05/2012 0931   MCHC 34.3 06/05/2012 0931   RDW 12.5 06/05/2012 0931    Hgb A1C No results found for: HGBA1C   Assessment and Plan:  Mass of Left Foot:  Referral placed to Podiatry for further evaluation and treatment  Make an appt for your annual exam Webb Silversmith, NP

## 2017-08-15 NOTE — Assessment & Plan Note (Signed)
Severe Will restart Wellbutrin, eRx sent to pharmacy Continue Buspar Continue Xanax as needed, will get yearly CSA and UDS

## 2017-08-15 NOTE — Patient Instructions (Signed)

## 2017-09-10 ENCOUNTER — Encounter: Payer: Self-pay | Admitting: Podiatry

## 2017-09-10 ENCOUNTER — Ambulatory Visit (INDEPENDENT_AMBULATORY_CARE_PROVIDER_SITE_OTHER): Payer: 59

## 2017-09-10 ENCOUNTER — Other Ambulatory Visit: Payer: Self-pay | Admitting: Podiatry

## 2017-09-10 ENCOUNTER — Ambulatory Visit (INDEPENDENT_AMBULATORY_CARE_PROVIDER_SITE_OTHER): Payer: 59 | Admitting: Podiatry

## 2017-09-10 DIAGNOSIS — R2242 Localized swelling, mass and lump, left lower limb: Secondary | ICD-10-CM

## 2017-09-10 DIAGNOSIS — M722 Plantar fascial fibromatosis: Secondary | ICD-10-CM

## 2017-09-10 NOTE — Patient Instructions (Signed)
Pre-Operative Instructions  Congratulations, you have decided to take an important step towards improving your quality of life.  You can be assured that the doctors and staff at [facility] will be with you every step of the way.  Here are some important things you should know:  1. Plan to be at the surgery center/hospital at least 1 (one) hour prior to your scheduled time, unless otherwise directed by the surgical center/hospital staff.  You must have a responsible adult accompany you, remain during the surgery and drive you home.  Make sure you have directions to the surgical center/hospital to ensure you arrive on time. 2. If you are having surgery at [facility] or [facility], you will need a copy of your medical history and physical form from your family physician within one month prior to the date of surgery. We will give you a form for your primary physician to complete.  3. We make every effort to accommodate the date you request for surgery.  However, there are times where surgery dates or times have to be moved.  We will contact you as soon as possible if a change in schedule is required.   4. No aspirin/ibuprofen for one week before surgery.  If you are on aspirin, any non-steroidal anti-inflammatory medications (Mobic, Aleve, Ibuprofen) should not be taken seven (7) days prior to your surgery.  You make take Tylenol for pain prior to surgery.  5. Medications - If you are taking daily heart and blood pressure medications, seizure, reflux, allergy, asthma, anxiety, pain or diabetes medications, make sure you notify the surgery center/hospital before the day of surgery so they can tell you which medications you should take or avoid the day of surgery. 6. No food or drink after midnight the night before surgery unless directed otherwise by surgical center/hospital staff. 7. No alcoholic beverages 24-hours prior to surgery.  No smoking 24-hours prior or 24-hours after  surgery. 8. Wear loose pants or shorts. They should be loose enough to fit over bandages, boots, and casts. 9. Don't wear slip-on shoes. Sneakers are preferred. 10. Bring your boot with you to the surgery center/hospital.  Also bring crutches or a walker if your physician has prescribed it for you.  If you do not have this equipment, it will be provided for you after surgery. 11. If you have not been contacted by the surgery center/hospital by the day before your surgery, call to confirm the date and time of your surgery. 12. Leave-time from work may vary depending on the type of surgery you have.  Appropriate arrangements should be made prior to surgery with your employer. 13. Prescriptions will be provided immediately following surgery by your doctor.  Fill these as soon as possible after surgery and take the medication as directed. Pain medications will not be refilled on weekends and must be approved by the doctor. 14. Remove nail polish on the operative foot and avoid getting pedicures prior to surgery. 15. Wash the night before surgery.  The night before surgery wash the foot and leg well with water and the antibacterial soap provided. Be sure to pay special attention to beneath the toenails and in between the toes.  Wash for at least three (3) minutes. Rinse thoroughly with water and dry well with a towel.  Perform this wash unless told not to do so by your physician.  Enclosed: 1 Ice pack (please put in freezer the night before surgery)   1 Hibiclens skin cleaner     Pre-op instructions  If you have any questions regarding the instructions, please do not hesitate to call our office.  [place]: [address], [place], [place] [address] -- [phone]  [place]: [address] [place], [place] [address] -- [phone]  [place]: [address]  [place], [place] [address] -- [phone]  [place]: [address], [address], [place], [place] [address] -- [phone]  Website:  [email or web address] 

## 2017-09-11 ENCOUNTER — Telehealth: Payer: Self-pay | Admitting: *Deleted

## 2017-09-11 NOTE — Telephone Encounter (Signed)
"  I'm calling to schedule my surgery."  Whose your doctor and have you signed consent forms?  "I'm a patient of Dr. Amalia Hailey and I believe I signed the consent forms yesterday when I saw him."  Do you have a date in mind that you would like to have surgery?"  I'd like to do it on September 12."  That date is available.  I will get it scheduled.  You can register with the surgical center at your convenience, instructions on how to do it is in the brochure that was given to you from the surgical center.

## 2017-09-12 NOTE — Progress Notes (Signed)
   HPI: 47 year old female presenting today as a new patient with a chief complaint of a mass to the plantar aspect of the right arch that appeared about one month ago. She states the nodule has changed shape since it first appeared. She has not done anything to treat the area. Walking and bearing weight increase the pain. Patient is here for further evaluation and treatment.   Past Medical History:  Diagnosis Date  . Depression       Physical Exam: General: The patient is alert and oriented x3 in no acute distress.  Dermatology: Skin is warm, dry and supple bilateral lower extremities. Negative for open lesions or macerations.  Vascular: Palpable pedal pulses bilaterally. No edema or erythema noted. Capillary refill within normal limits.  Neurological: Epicritic and protective threshold grossly intact bilaterally.   Musculoskeletal Exam: Palpable nodule noted to the plantar medial longitudinal arch of the right foot. Pain with palpation also noted to the area. Range of motion within normal limits to all pedal and ankle joints bilateral. Muscle strength 5/5 in all groups bilateral.   Radiographic Exam:  Normal osseous mineralization. Joint spaces preserved. No fracture/dislocation/boney destruction.    Assessment: - plantar fibroma right   Plan of Care:  - Patient evaluated. X-Rays reviewed.  - Today we discussed the conservative versus surgical management of the presenting pathology. The patient opts for surgical management. All possible complications and details of the procedure were explained. All patient questions were answered. No guarantees were expressed or implied. - Authorization for surgery was initiated today. Surgery will consist of excision of the plantar fibroma of the right foot.  - Return to clinic one week post op.   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    Edrick Kins, DPM [facility]  Dr. Edrick Kins, DPM    [address] [place], [place] [address]                Office [phone]  Fax [phone]

## 2017-10-14 ENCOUNTER — Encounter: Payer: Self-pay | Admitting: Internal Medicine

## 2017-10-14 ENCOUNTER — Ambulatory Visit (INDEPENDENT_AMBULATORY_CARE_PROVIDER_SITE_OTHER): Payer: 59 | Admitting: Internal Medicine

## 2017-10-14 VITALS — BP 122/74 | HR 71 | Temp 98.0°F | Ht 61.5 in | Wt 158.0 lb

## 2017-10-14 DIAGNOSIS — Z0000 Encounter for general adult medical examination without abnormal findings: Secondary | ICD-10-CM

## 2017-10-14 LAB — COMPREHENSIVE METABOLIC PANEL WITH GFR
ALT: 17 U/L (ref 0–35)
AST: 19 U/L (ref 0–37)
Albumin: 4.2 g/dL (ref 3.5–5.2)
Alkaline Phosphatase: 57 U/L (ref 39–117)
BUN: 18 mg/dL (ref 6–23)
CO2: 26 meq/L (ref 19–32)
Calcium: 9 mg/dL (ref 8.4–10.5)
Chloride: 105 meq/L (ref 96–112)
Creatinine, Ser: 0.77 mg/dL (ref 0.40–1.20)
GFR: 84.97 mL/min
Glucose, Bld: 76 mg/dL (ref 70–99)
Potassium: 3.7 meq/L (ref 3.5–5.1)
Sodium: 138 meq/L (ref 135–145)
Total Bilirubin: 0.5 mg/dL (ref 0.2–1.2)
Total Protein: 7.4 g/dL (ref 6.0–8.3)

## 2017-10-14 LAB — LIPID PANEL
CHOL/HDL RATIO: 4
Cholesterol: 222 mg/dL — ABNORMAL HIGH (ref 0–200)
HDL: 51.9 mg/dL (ref >39.00)
LDL CALC: 150 mg/dL — AB (ref 0–99)
NonHDL: 170.36
TRIGLYCERIDES: 103 mg/dL (ref 0.0–149.0)
VLDL: 20.6 mg/dL (ref 0.0–40.0)

## 2017-10-14 LAB — CBC
HEMATOCRIT: 44.6 % (ref 36.0–46.0)
HEMOGLOBIN: 14.9 g/dL (ref 12.0–15.0)
MCHC: 33.3 g/dL (ref 30.0–36.0)
MCV: 90.8 fl (ref 78.0–100.0)
PLATELETS: 308 10*3/uL (ref 150.0–400.0)
RBC: 4.91 Mil/uL (ref 3.87–5.11)
RDW: 13 % (ref 11.5–15.5)
WBC: 6.9 10*3/uL (ref 4.0–10.5)

## 2017-10-14 LAB — VITAMIN D 25 HYDROXY (VIT D DEFICIENCY, FRACTURES): VITD: 40.58 ng/mL (ref 30.00–100.00)

## 2017-10-14 MED ORDER — BUSPIRONE HCL 15 MG PO TABS: ORAL_TABLET | ORAL | 3 refills | Status: DC

## 2017-10-14 NOTE — Patient Instructions (Signed)

## 2017-10-14 NOTE — Progress Notes (Signed)
Subjective:    Patient ID: Regina Salazar, female    DOB: Nov 29, 1970, 47 y.o.   MRN: [id number]  HPI  Pt presents to the clinic today for her annual exam.  Flu: never Tetanus: 2013 Pap Smear: 2013 Mammogram: 2013 Vision Screening: annually Dentist: biannually  Diet: She does eat lean meat. She consumes fruits and veggies daily. She rarely eats fried foods. She drinks mostly water. Exercise: None  Review of Systems    Past Medical History:  Diagnosis Date  . Depression     Current Outpatient Medications  Medication Sig Dispense Refill  . ALPRAZolam (XANAX) 0.5 MG tablet 1/2 to 1 tablet up to three times as needed for panic attacks and insomnia 60 tablet 0  . buPROPion (WELLBUTRIN XL) 150 MG 24 hr tablet Take 1 tablet (150 mg total) by mouth daily. 90 tablet 3  . busPIRone (BUSPAR) 15 MG tablet 1 tablet twice daily 180 tablet 3   No current facility-administered medications for this visit.     Allergies  Allergen Reactions  . Prednisone Anxiety    Induces her anxiety    Family History  Problem Relation Age of Onset  . Cancer Mother 56       breast  . Cancer Father        colon  . Diabetes Father     Social History   Socioeconomic History  . Marital status: Married    Spouse name: Not on file  . Number of children: Not on file  . Years of education: Not on file  . Highest education level: Not on file  Occupational History  . Not on file  Social Needs  . Financial resource strain: Not on file  . Food insecurity:    Worry: Not on file    Inability: Not on file  . Transportation needs:    Medical: Not on file    Non-medical: Not on file  Tobacco Use  . Smoking status: Never Smoker  . Smokeless tobacco: Never Used  Substance and Sexual Activity  . Alcohol use: No  . Drug use: No  . Sexual activity: Yes    Partners: Male    Birth control/protection: None  Lifestyle  . Physical activity:    Days per week: Not on file    Minutes per session: Not  on file  . Stress: Not on file  Relationships  . Social connections:    Talks on phone: Not on file    Gets together: Not on file    Attends religious service: Not on file    Active member of club or organization: Not on file    Attends meetings of clubs or organizations: Not on file    Relationship status: Not on file  . Intimate partner violence:    Fear of current or ex partner: Not on file    Emotionally abused: Not on file    Physically abused: Not on file    Forced sexual activity: Not on file  Other Topics Concern  . Not on file  Social History Narrative   Moved here from [place] last year for her husband's job.   3 adopted children ages 35, 27, 43.      Works at a daycare.     Constitutional: Denies fever, malaise, fatigue, headache or abrupt weight changes.  HEENT: Denies eye pain, eye redness, ear pain, ringing in the ears, wax buildup, runny nose, nasal congestion, bloody nose, or sore throat. Respiratory: Denies difficulty breathing, shortness of  breath, cough or sputum production.   Cardiovascular: Denies chest pain, chest tightness, palpitations or swelling in the hands or feet.  Gastrointestinal: Denies abdominal pain, bloating, constipation, diarrhea or blood in the stool.  GU: Denies urgency, frequency, pain with urination, burning sensation, blood in urine, odor or discharge. Musculoskeletal: Denies decrease in range of motion, difficulty with gait, muscle pain or joint pain and swelling.  Skin: Denies redness, rashes, lesions or ulcercations.  Neurological: Denies dizziness, difficulty with memory, difficulty with speech or problems with balance and coordination.  Psych: Pt reports anxiety. Denies depression, SI/HI.  No other specific complaints in a complete review of systems (except as listed in HPI above).     Objective:   Physical Exam  BP 122/74   Pulse 71   Temp 98 F (36.7 C) (Oral)   Ht 5' 1.5" (1.562 m)   Wt 158 lb (71.7 kg)   SpO2 98%    BMI 29.37 kg/m  Wt Readings from Last 3 Encounters:  10/14/17 158 lb (71.7 kg)  08/15/17 160 lb (72.6 kg)  03/18/17 151 lb 4 oz (68.6 kg)    General: Appears her stated age, well developed, well nourished in NAD. Skin: Warm, dry and intact.  HEENT: Head: normal shape and size; Eyes: sclera white, no icterus, conjunctiva pink, PERRLA and EOMs intact; Ears: Tm's gray and intact, normal light reflex; Throat/Mouth: Teeth present, mucosa pink and moist, no exudate, lesions or ulcerations noted.  Neck:  Neck supple, trachea midline. No masses, lumps or thyromegaly present.  Cardiovascular: Normal rate and rhythm. S1,S2 noted.  No murmur, rubs or gallops noted. No JVD or BLE edema.  Pulmonary/Chest: Normal effort and positive vesicular breath sounds. No respiratory distress. No wheezes, rales or ronchi noted.  Abdomen: Soft and nontender. Normal bowel sounds. No distention or masses noted. Liver, spleen and kidneys non palpable. Musculoskeletal: Strength 5/5 BUE/BLE. No difficulty with gait.  Neurological: Alert and oriented. Cranial nerves II-XII grossly intact. Coordination normal.  Psychiatric: She is anxious appearing today. Behavior is normal. Judgment and thought content normal.     BMET    Component Value Date/Time   NA 136 04/29/2012 0851   K 3.8 04/29/2012 0851   CL 105 04/29/2012 0851   CO2 20 04/29/2012 0851   GLUCOSE 106 (H) 04/29/2012 0851   BUN 16 04/29/2012 0851   CREATININE 0.7 04/29/2012 0851   CALCIUM 8.6 04/29/2012 0851    Lipid Panel     Component Value Date/Time   CHOL 203 (H) 04/29/2012 0851   TRIG 88.0 04/29/2012 0851   HDL 37.70 (L) 04/29/2012 0851   CHOLHDL 5 04/29/2012 0851   VLDL 17.6 04/29/2012 0851    CBC    Component Value Date/Time   WBC 7.6 06/05/2012 0931   RBC 4.81 06/05/2012 0931   HGB 14.8 06/05/2012 0931   HCT 43.2 06/05/2012 0931   PLT 316 06/05/2012 0931   MCV 89.8 06/05/2012 0931   MCH 30.8 06/05/2012 0931   MCHC 34.3 06/05/2012  0931   RDW 12.5 06/05/2012 0931    Hgb A1C No results found for: HGBA1C          Assessment & Plan:   Preventative Health Maintenance:  Encouraged her to get a flu shot in the fall Tetanus UTD She declines pap smear- requires general anesthesia Mammogram ordered, [facility] Encouraged her to consume a balanced diet and exercise regimen Advised her to see an eye doctor and dentist annually  Will check CBC, CMET, Lipid, Vit D today  RTC in 1 year, sooner if needed Webb Silversmith, NP

## 2017-10-30 ENCOUNTER — Encounter: Payer: Self-pay | Admitting: Internal Medicine

## 2017-10-31 MED ORDER — BUSPIRONE HCL 15 MG PO TABS: ORAL_TABLET | ORAL | 2 refills | Status: DC

## 2018-01-13 ENCOUNTER — Telehealth: Payer: Self-pay | Admitting: *Deleted

## 2018-01-13 NOTE — Telephone Encounter (Signed)
"  I am calling to cancel a surgery appointment.  It's for September 12.  I would just like to cancel it.  Give me a call back.  I go back to work in 10 minutes and I probably won't be home until 5 pm.  Hopefully I can get to you somewhere around there."

## 2018-01-15 NOTE — Telephone Encounter (Signed)
"  I'm calling to cancel my surgery for September 12.  Please give me a call back."

## 2018-01-16 NOTE — Telephone Encounter (Signed)
"  I'm calling to cancel my surgery, that is scheduled for 02/27/2018."  Would you like to reschedule or call later to reschedule?  "I can't reschedule at this time.  My mother-in-law is dying, she has cancer.  She could pass at anytime now."  I am sorry to hear.  I'll let Dr. Amalia Hailey know and I'll cancel the surgery.  I called Caren Griffins at [facility] and canceled the surgery.

## 2018-06-10 ENCOUNTER — Other Ambulatory Visit: Payer: Self-pay | Admitting: Internal Medicine

## 2018-09-02 ENCOUNTER — Other Ambulatory Visit: Payer: Self-pay | Admitting: Internal Medicine

## 2018-11-17 ENCOUNTER — Other Ambulatory Visit: Payer: Self-pay | Admitting: Internal Medicine

## 2018-11-17 ENCOUNTER — Encounter: Payer: Self-pay | Admitting: Internal Medicine

## 2018-11-18 ENCOUNTER — Encounter: Payer: Self-pay | Admitting: Internal Medicine

## 2018-11-18 ENCOUNTER — Ambulatory Visit (INDEPENDENT_AMBULATORY_CARE_PROVIDER_SITE_OTHER): Payer: 59 | Admitting: Internal Medicine

## 2018-11-18 DIAGNOSIS — F419 Anxiety disorder, unspecified: Secondary | ICD-10-CM

## 2018-11-18 DIAGNOSIS — F329 Major depressive disorder, single episode, unspecified: Secondary | ICD-10-CM | POA: Diagnosis not present

## 2018-11-18 MED ORDER — BUSPIRONE HCL 15 MG PO TABS: 15.0000 mg | ORAL_TABLET | Freq: Three times a day (TID) | ORAL | 1 refills | Status: DC

## 2018-11-18 MED ORDER — ALPRAZOLAM 0.5 MG PO TABS: 0.5000 mg | ORAL_TABLET | Freq: Every day | ORAL | 0 refills | Status: DC | PRN

## 2018-11-18 MED ORDER — BUPROPION HCL ER (XL) 150 MG PO TB24: 150.0000 mg | ORAL_TABLET | Freq: Every day | ORAL | 1 refills | Status: DC

## 2018-11-18 NOTE — Progress Notes (Signed)
Virtual Visit via Video Note  I connected with Regina Salazar on 11/18/18 at 11:30 AM EDT by a video enabled telemedicine application and verified that I am speaking with the correct person using two identifiers.  Location: Patient: Home Provider: Office   I discussed the limitations of evaluation and management by telemedicine and the availability of in person appointments. The patient expressed understanding and agreed to proceed.  History of Present Illness:  Pt due for follow up of anxiety and depression. She reports worsening anxiety over the last 3 months. She has been having intermittent panic attacks. She is taking Wellbutrin and Buspar as prescribed. She has taken Xanax in the past, and is wondering if she can have this prescribed again. She is not currently seeing a therapist. She denies SI/HI.   Past Medical History:  Diagnosis Date  . Depression     Current Outpatient Medications  Medication Sig Dispense Refill  . ALPRAZolam (XANAX) 0.5 MG tablet 1/2 to 1 tablet up to three times as needed for panic attacks and insomnia 60 tablet 0  . buPROPion (WELLBUTRIN XL) 150 MG 24 hr tablet TAKE 1 TABLET BY MOUTH  DAILY 90 tablet 1  . busPIRone (BUSPAR) 15 MG tablet Take 1 tablet (15 mg total) by mouth 2 (two) times daily. MUST SCHEDULE PHYSICAL EXAM 180 tablet 0   No current facility-administered medications for this visit.     Allergies  Allergen Reactions  . Prednisone Anxiety    Induces her anxiety    Family History  Problem Relation Age of Onset  . Cancer Mother 57       breast  . Cancer Father        colon  . Diabetes Father     Social History   Socioeconomic History  . Marital status: Married    Spouse name: Not on file  . Number of children: Not on file  . Years of education: Not on file  . Highest education level: Not on file  Occupational History  . Not on file  Social Needs  . Financial resource strain: Not on file  . Food insecurity:    Worry: Not  on file    Inability: Not on file  . Transportation needs:    Medical: Not on file    Non-medical: Not on file  Tobacco Use  . Smoking status: Never Smoker  . Smokeless tobacco: Never Used  Substance and Sexual Activity  . Alcohol use: No  . Drug use: No  . Sexual activity: Yes    Partners: Male    Birth control/protection: None  Lifestyle  . Physical activity:    Days per week: Not on file    Minutes per session: Not on file  . Stress: Not on file  Relationships  . Social connections:    Talks on phone: Not on file    Gets together: Not on file    Attends religious service: Not on file    Active member of club or organization: Not on file    Attends meetings of clubs or organizations: Not on file    Relationship status: Not on file  . Intimate partner violence:    Fear of current or ex partner: Not on file    Emotionally abused: Not on file    Physically abused: Not on file    Forced sexual activity: Not on file  Other Topics Concern  . Not on file  Social History Narrative   Moved here from [place] last  year for her husband's job.   3 adopted children ages 67, 11, 34.      Works at a daycare.     Constitutional: Denies fever, malaise, fatigue, headache or abrupt weight changes.  Respiratory: Denies difficulty breathing, shortness of breath, cough or sputum production.   Cardiovascular: Denies chest pain, chest tightness, palpitations or swelling in the hands or feet.  Neurological: Denies dizziness, difficulty with memory, difficulty with speech or problems with balance and coordination.  Psych: Pt reports anxiety and depression. Denies SI/HI.  No other specific complaints in a complete review of systems (except as listed in HPI above).  Wt Readings from Last 3 Encounters:  10/14/17 158 lb (71.7 kg)  08/15/17 160 lb (72.6 kg)  03/18/17 151 lb 4 oz (68.6 kg)    General: Appears her stated age, well developed, well nourished in NAD. Pulmonary/Chest: Normal  effort. No respiratory distress.  Neurological: Alert and oriented.  Psychiatric: Mood and affect normal. Behavior is normal. Judgment and thought content normal.     BMET    Component Value Date/Time   NA 138 10/14/2017 1141   K 3.7 10/14/2017 1141   CL 105 10/14/2017 1141   CO2 26 10/14/2017 1141   GLUCOSE 76 10/14/2017 1141   BUN 18 10/14/2017 1141   CREATININE 0.77 10/14/2017 1141   CALCIUM 9.0 10/14/2017 1141    Lipid Panel     Component Value Date/Time   CHOL 222 (H) 10/14/2017 1141   TRIG 103.0 10/14/2017 1141   HDL 51.90 10/14/2017 1141   CHOLHDL 4 10/14/2017 1141   VLDL 20.6 10/14/2017 1141   LDLCALC 150 (H) 10/14/2017 1141    CBC    Component Value Date/Time   WBC 6.9 10/14/2017 1141   RBC 4.91 10/14/2017 1141   HGB 14.9 10/14/2017 1141   HCT 44.6 10/14/2017 1141   PLT 308.0 10/14/2017 1141   MCV 90.8 10/14/2017 1141   MCH 30.8 06/05/2012 0931   MCHC 33.3 10/14/2017 1141   RDW 13.0 10/14/2017 1141    Hgb A1C No results found for: HGBA1C      Assessment and Plan:  Anxiety and Depression:  PHQ 9 and GAD 7 completed Deteriorated Support offered today Continue Wellbutrin Increase Buspar to 15 mg TID RX for Xanax 0.5 gm daily prn She declines referral for therapy  Return precautions discussed  Follow Up Instructions:    I discussed the assessment and treatment plan with the patient. The patient was provided an opportunity to ask questions and all were answered. The patient agreed with the plan and demonstrated an understanding of the instructions.   The patient was advised to call back or seek an in-person evaluation if the symptoms worsen or if the condition fails to improve as anticipated.     Webb Silversmith, NP

## 2018-11-18 NOTE — Patient Instructions (Signed)

## 2018-11-20 ENCOUNTER — Encounter: Payer: Self-pay | Admitting: Internal Medicine

## 2018-12-05 ENCOUNTER — Encounter: Payer: Self-pay | Admitting: Internal Medicine

## 2018-12-08 ENCOUNTER — Ambulatory Visit (INDEPENDENT_AMBULATORY_CARE_PROVIDER_SITE_OTHER): Payer: 59 | Admitting: Internal Medicine

## 2018-12-08 ENCOUNTER — Encounter: Payer: Self-pay | Admitting: Internal Medicine

## 2018-12-08 DIAGNOSIS — W57XXXA Bitten or stung by nonvenomous insect and other nonvenomous arthropods, initial encounter: Secondary | ICD-10-CM | POA: Diagnosis not present

## 2018-12-08 DIAGNOSIS — S30861A Insect bite (nonvenomous) of abdominal wall, initial encounter: Secondary | ICD-10-CM

## 2018-12-08 MED ORDER — DOXYCYCLINE HYCLATE 100 MG PO TABS: 100.0000 mg | ORAL_TABLET | Freq: Two times a day (BID) | ORAL | 0 refills | Status: DC

## 2018-12-08 NOTE — Progress Notes (Signed)
Virtual Visit via Video Note  I connected with Regina Salazar on 12/08/18 at 11:45 AM EDT by a video enabled telemedicine application and verified that I am speaking with the correct person using two identifiers.  Location: Patient: Home Provider: Office   I discussed the limitations of evaluation and management by telemedicine and the availability of in person appointments. The patient expressed understanding and agreed to proceed.  History of Present Illness:  Pt reports tick bite of abdomen. She reports she pulled a tick off her abdomen while in the shower 2 weeks ago. She does not know how long the tick was on her but she does not feel like it was engorged. She did notice a rash in the area about 1 week ago. It does itch. She denies confusion, nausea, vomiting, loose stools, joint pain, or weakness. She has not put anything OTC.    Past Medical History:  Diagnosis Date  . Depression     Current Outpatient Medications  Medication Sig Dispense Refill  . ALPRAZolam (XANAX) 0.5 MG tablet Take 1 tablet (0.5 mg total) by mouth daily as needed for anxiety. 1/2 to 1 tablet up to three times as needed for panic attacks and insomnia 30 tablet 0  . buPROPion (WELLBUTRIN XL) 150 MG 24 hr tablet Take 1 tablet (150 mg total) by mouth daily. 90 tablet 1  . busPIRone (BUSPAR) 15 MG tablet Take 1 tablet (15 mg total) by mouth 3 (three) times daily. MUST SCHEDULE PHYSICAL EXAM 270 tablet 1   No current facility-administered medications for this visit.     Allergies  Allergen Reactions  . Prednisone Anxiety    Induces her anxiety    Family History  Problem Relation Age of Onset  . Cancer Mother 86       breast  . Cancer Father        colon  . Diabetes Father     Social History   Socioeconomic History  . Marital status: Married    Spouse name: Not on file  . Number of children: Not on file  . Years of education: Not on file  . Highest education level: Not on file  Occupational  History  . Not on file  Social Needs  . Financial resource strain: Not on file  . Food insecurity    Worry: Not on file    Inability: Not on file  . Transportation needs    Medical: Not on file    Non-medical: Not on file  Tobacco Use  . Smoking status: Never Smoker  . Smokeless tobacco: Never Used  Substance and Sexual Activity  . Alcohol use: No  . Drug use: No  . Sexual activity: Yes    Partners: Male    Birth control/protection: None  Lifestyle  . Physical activity    Days per week: Not on file    Minutes per session: Not on file  . Stress: Not on file  Relationships  . Social Herbalist on phone: Not on file    Gets together: Not on file    Attends religious service: Not on file    Active member of club or organization: Not on file    Attends meetings of clubs or organizations: Not on file    Relationship status: Not on file  . Intimate partner violence    Fear of current or ex partner: Not on file    Emotionally abused: Not on file    Physically abused: Not on  file    Forced sexual activity: Not on file  Other Topics Concern  . Not on file  Social History Narrative   Moved here from [place] last year for her husband's job.   3 adopted children ages 81, 12, 34.      Works at a daycare.     Constitutional: Denies fever, malaise, fatigue, headache or abrupt weight changes.  Respiratory: Denies difficulty breathing, shortness of breath, cough or sputum production.   Cardiovascular: Denies chest pain, chest tightness, palpitations or swelling in the hands or feet.  Gastrointestinal: Denies abdominal pain, bloating, constipation, diarrhea or blood in the stool.  Musculoskeletal: Denies decrease in range of motion, difficulty with gait, muscle pain or joint pain and swelling.  Skin: Pt reports tick bite of abdomen. Neurological: Denies dizziness, difficulty with memory, difficulty with speech or problems with balance and coordination.    No other  specific complaints in a complete review of systems (except as listed in HPI above).   Wt Readings from Last 3 Encounters:  10/14/17 158 lb (71.7 kg)  08/15/17 160 lb (72.6 kg)  03/18/17 151 lb 4 oz (68.6 kg)    General: Appears her stated age, obese, in NAD. Skin: Tick bite noted of right abdomen with surrounding rash. Hard to tell if it is just a rash or erythema migrans in the video Pulmonary/Chest: Normal effort. No respiratory distress.  Abdomen: Soft and nontender. Normal bowel sounds. No distention or masses noted. Liver, spleen and kidneys non palpable. Neurological: Alert and oriented.     BMET    Component Value Date/Time   NA 138 10/14/2017 1141   K 3.7 10/14/2017 1141   CL 105 10/14/2017 1141   CO2 26 10/14/2017 1141   GLUCOSE 76 10/14/2017 1141   BUN 18 10/14/2017 1141   CREATININE 0.77 10/14/2017 1141   CALCIUM 9.0 10/14/2017 1141    Lipid Panel     Component Value Date/Time   CHOL 222 (H) 10/14/2017 1141   TRIG 103.0 10/14/2017 1141   HDL 51.90 10/14/2017 1141   CHOLHDL 4 10/14/2017 1141   VLDL 20.6 10/14/2017 1141   LDLCALC 150 (H) 10/14/2017 1141    CBC    Component Value Date/Time   WBC 6.9 10/14/2017 1141   RBC 4.91 10/14/2017 1141   HGB 14.9 10/14/2017 1141   HCT 44.6 10/14/2017 1141   PLT 308.0 10/14/2017 1141   MCV 90.8 10/14/2017 1141   MCH 30.8 06/05/2012 0931   MCHC 33.3 10/14/2017 1141   RDW 13.0 10/14/2017 1141    Hgb A1C No results found for: HGBA1C      Assessment and Plan:  Tick Bite of Abdomen:  Can use Hydrocortisone cream OTC RX for Doxycycline 100 mg BID for treatment of possible early cellulitis vs tick born illness  Return precautions discussed  Follow Up Instructions:    I discussed the assessment and treatment plan with the patient. The patient was provided an opportunity to ask questions and all were answered. The patient agreed with the plan and demonstrated an understanding of the instructions.   The  patient was advised to call back or seek an in-person evaluation if the symptoms worsen or if the condition fails to improve as anticipated.     Webb Silversmith, NP

## 2018-12-08 NOTE — Patient Instructions (Signed)

## 2019-03-09 ENCOUNTER — Ambulatory Visit: Payer: 59 | Admitting: Internal Medicine

## 2019-03-12 ENCOUNTER — Ambulatory Visit: Payer: 59 | Admitting: Internal Medicine

## 2019-03-12 NOTE — Progress Notes (Signed)
Erroneous encounter

## 2019-04-07 ENCOUNTER — Encounter: Payer: Self-pay | Admitting: Internal Medicine

## 2019-04-07 ENCOUNTER — Ambulatory Visit (INDEPENDENT_AMBULATORY_CARE_PROVIDER_SITE_OTHER): Payer: 59 | Admitting: Internal Medicine

## 2019-04-07 ENCOUNTER — Other Ambulatory Visit: Payer: Self-pay

## 2019-04-07 ENCOUNTER — Telehealth: Payer: Self-pay

## 2019-04-07 VITALS — BP 124/78 | HR 88 | Temp 98.0°F | Ht 61.75 in | Wt 176.0 lb

## 2019-04-07 DIAGNOSIS — Z1231 Encounter for screening mammogram for malignant neoplasm of breast: Secondary | ICD-10-CM | POA: Diagnosis not present

## 2019-04-07 DIAGNOSIS — N951 Menopausal and female climacteric states: Secondary | ICD-10-CM

## 2019-04-07 DIAGNOSIS — Z0000 Encounter for general adult medical examination without abnormal findings: Secondary | ICD-10-CM

## 2019-04-07 DIAGNOSIS — Z1211 Encounter for screening for malignant neoplasm of colon: Secondary | ICD-10-CM

## 2019-04-07 DIAGNOSIS — Z79899 Other long term (current) drug therapy: Secondary | ICD-10-CM

## 2019-04-07 DIAGNOSIS — F329 Major depressive disorder, single episode, unspecified: Secondary | ICD-10-CM

## 2019-04-07 DIAGNOSIS — F419 Anxiety disorder, unspecified: Secondary | ICD-10-CM

## 2019-04-07 DIAGNOSIS — F32A Depression, unspecified: Secondary | ICD-10-CM

## 2019-04-07 LAB — COMPREHENSIVE METABOLIC PANEL
ALT: 23 U/L (ref 0–35)
AST: 23 U/L (ref 0–37)
Albumin: 4.4 g/dL (ref 3.5–5.2)
Alkaline Phosphatase: 70 U/L (ref 39–117)
BUN: 13 mg/dL (ref 6–23)
CO2: 23 mEq/L (ref 19–32)
Calcium: 9.1 mg/dL (ref 8.4–10.5)
Chloride: 103 mEq/L (ref 96–112)
Creatinine, Ser: 0.75 mg/dL (ref 0.40–1.20)
GFR: 81.91 mL/min (ref >60.00)
Glucose, Bld: 107 mg/dL — ABNORMAL HIGH (ref 70–99)
Potassium: 4 mEq/L (ref 3.5–5.1)
Sodium: 135 mEq/L (ref 135–145)
Total Bilirubin: 0.7 mg/dL (ref 0.2–1.2)
Total Protein: 7.3 g/dL (ref 6.0–8.3)

## 2019-04-07 LAB — CBC
HCT: 43.6 % (ref 36.0–46.0)
Hemoglobin: 14.7 g/dL (ref 12.0–15.0)
MCHC: 33.7 g/dL (ref 30.0–36.0)
MCV: 90.4 fl (ref 78.0–100.0)
Platelets: 336 10*3/uL (ref 150.0–400.0)
RBC: 4.82 Mil/uL (ref 3.87–5.11)
RDW: 12.7 % (ref 11.5–15.5)
WBC: 7 10*3/uL (ref 4.0–10.5)

## 2019-04-07 LAB — VITAMIN D 25 HYDROXY (VIT D DEFICIENCY, FRACTURES): VITD: 37.01 ng/mL (ref 30.00–100.00)

## 2019-04-07 LAB — LIPID PANEL
Cholesterol: 247 mg/dL — ABNORMAL HIGH (ref 0–200)
HDL: 46.2 mg/dL (ref >39.00)
LDL Cholesterol: 174 mg/dL — ABNORMAL HIGH (ref 0–99)
NonHDL: 201.1
Total CHOL/HDL Ratio: 5
Triglycerides: 136 mg/dL (ref 0.0–149.0)
VLDL: 27.2 mg/dL (ref 0.0–40.0)

## 2019-04-07 LAB — LUTEINIZING HORMONE: LH: 62.68 m[IU]/mL

## 2019-04-07 LAB — HEMOGLOBIN A1C: Hgb A1c MFr Bld: 6.2 % (ref 4.6–6.5)

## 2019-04-07 LAB — FOLLICLE STIMULATING HORMONE: FSH: 40.2 m[IU]/mL

## 2019-04-07 LAB — TSH: TSH: 2.95 u[IU]/mL (ref 0.35–4.50)

## 2019-04-07 NOTE — Patient Instructions (Signed)
Health Maintenance, Female Adopting a healthy lifestyle and getting preventive care are important in promoting health and wellness. Ask your health care provider about:  The right schedule for you to have regular tests and exams.  Things you can do on your own to prevent diseases and keep yourself healthy. What should I know about diet, weight, and exercise? Eat a healthy diet   Eat a diet that includes plenty of vegetables, fruits, low-fat dairy products, and lean protein.  Do not eat a lot of foods that are high in solid fats, added sugars, or sodium. Maintain a healthy weight Body mass index (BMI) is used to identify weight problems. It estimates body fat based on height and weight. Your health care provider can help determine your BMI and help you achieve or maintain a healthy weight. Get regular exercise Get regular exercise. This is one of the most important things you can do for your health. Most adults should:  Exercise for at least 150 minutes each week. The exercise should increase your heart rate and make you sweat (moderate-intensity exercise).  Do strengthening exercises at least twice a week. This is in addition to the moderate-intensity exercise.  Spend less time sitting. Even light physical activity can be beneficial. Watch cholesterol and blood lipids Have your blood tested for lipids and cholesterol at 49 years of age, then have this test every 5 years. Have your cholesterol levels checked more often if:  Your lipid or cholesterol levels are high.  You are older than 49 years of age.  You are at high risk for heart disease. What should I know about cancer screening? Depending on your health history and family history, you may need to have cancer screening at various ages. This may include screening for:  Breast cancer.  Cervical cancer.  Colorectal cancer.  Skin cancer.  Lung cancer. What should I know about heart disease, diabetes, and high blood  pressure? Blood pressure and heart disease  High blood pressure causes heart disease and increases the risk of stroke. This is more likely to develop in people who have high blood pressure readings, are of African descent, or are overweight.  Have your blood pressure checked: ? Every 3-5 years if you are 18-39 years of age. ? Every year if you are 40 years old or older. Diabetes Have regular diabetes screenings. This checks your fasting blood sugar level. Have the screening done:  Once every three years after age 40 if you are at a normal weight and have a low risk for diabetes.  More often and at a younger age if you are overweight or have a high risk for diabetes. What should I know about preventing infection? Hepatitis B If you have a higher risk for hepatitis B, you should be screened for this virus. Talk with your health care provider to find out if you are at risk for hepatitis B infection. Hepatitis C Testing is recommended for:  Everyone born from 1945 through 1965.  Anyone with known risk factors for hepatitis C. Sexually transmitted infections (STIs)  Get screened for STIs, including gonorrhea and chlamydia, if: ? You are sexually active and are younger than 49 years of age. ? You are older than 49 years of age and your health care provider tells you that you are at risk for this type of infection. ? Your sexual activity has changed since you were last screened, and you are at increased risk for chlamydia or gonorrhea. Ask your health care provider if   you are at risk.  Ask your health care provider about whether you are at high risk for HIV. Your health care provider may recommend a prescription medicine to help prevent HIV infection. If you choose to take medicine to prevent HIV, you should first get tested for HIV. You should then be tested every 3 months for as long as you are taking the medicine. Pregnancy  If you are about to stop having your period (premenopausal) and  you may become pregnant, seek counseling before you get pregnant.  Take 400 to 800 micrograms (mcg) of folic acid every day if you become pregnant.  Ask for birth control (contraception) if you want to prevent pregnancy. Osteoporosis and menopause Osteoporosis is a disease in which the bones lose minerals and strength with aging. This can result in bone fractures. If you are 65 years old or older, or if you are at risk for osteoporosis and fractures, ask your health care provider if you should:  Be screened for bone loss.  Take a calcium or vitamin D supplement to lower your risk of fractures.  Be given hormone replacement therapy (HRT) to treat symptoms of menopause. Follow these instructions at home: Lifestyle  Do not use any products that contain nicotine or tobacco, such as cigarettes, e-cigarettes, and chewing tobacco. If you need help quitting, ask your health care provider.  Do not use street drugs.  Do not share needles.  Ask your health care provider for help if you need support or information about quitting drugs. Alcohol use  Do not drink alcohol if: ? Your health care provider tells you not to drink. ? You are pregnant, may be pregnant, or are planning to become pregnant.  If you drink alcohol: ? Limit how much you use to 0-1 drink a day. ? Limit intake if you are breastfeeding.  Be aware of how much alcohol is in your drink. In the [place] one drink equals one 12 oz bottle of beer (355 mL), one 5 oz glass of wine (148 mL), or one 1 oz glass of hard liquor (44 mL). General instructions  Schedule regular health, dental, and eye exams.  Stay current with your vaccines.  Tell your health care provider if: ? You often feel depressed. ? You have ever been abused or do not feel safe at home. Summary  Adopting a healthy lifestyle and getting preventive care are important in promoting health and wellness.  Follow your health care provider's instructions about healthy  diet, exercising, and getting tested or screened for diseases.  Follow your health care provider's instructions on monitoring your cholesterol and blood pressure. This information is not intended to replace advice given to you by your health care provider. Make sure you discuss any questions you have with your health care provider. Document Released: 12/18/2010 Document Revised: 05/28/2018 Document Reviewed: 05/28/2018 Elsevier Patient Education  2020 Elsevier Inc.  

## 2019-04-07 NOTE — Telephone Encounter (Signed)
[facility] Night - Client Nonclinical Telephone Record [facility] Client [facility] Night - Client Client Site [place] Physician Webb Silversmith - NP Contact Type Call Who Is Calling Patient / Member / Family / Caregiver Caller Name Cadi Seahorn Caller Phone Number [phone] Call Type Message Only Information Provided Reason for Call Returning a Call from the Office Initial Comment Additional Comment Call Closed By: Mable Paris Transaction Date/Time: 04/06/2019 5:05:00 PM (ET)

## 2019-04-07 NOTE — Progress Notes (Signed)
Subjective:    Patient ID: Regina Salazar, female    DOB: 1970-06-21, 48 y.o.   MRN: [id number]  HPI  Pt presents to the clinic today for her annual exam.   Anxiety and Depression: Persistent but improved since she has been on Wellbutrin, Buspar, and Xanax. She is not currently seeing a therapist. She denies SI/HI.  Flu: never Tetanus: 2013 Pap Smear: 2013 Mammogram: 2013 Vision Screening: annually Dentist: biannually  Diet: She eats lean meats. She consumes fruits and veggies daily. She does not eat fried foods. She drinks mostly water. Exercise: None  Review of Systems  Past Medical History:  Diagnosis Date  . Depression     Current Outpatient Medications  Medication Sig Dispense Refill  . ALPRAZolam (XANAX) 0.5 MG tablet Take 1 tablet (0.5 mg total) by mouth daily as needed for anxiety. 1/2 to 1 tablet up to three times as needed for panic attacks and insomnia 30 tablet 0  . buPROPion (WELLBUTRIN XL) 150 MG 24 hr tablet Take 1 tablet (150 mg total) by mouth daily. 90 tablet 1  . busPIRone (BUSPAR) 15 MG tablet Take 1 tablet (15 mg total) by mouth 3 (three) times daily. MUST SCHEDULE PHYSICAL EXAM 270 tablet 1   No current facility-administered medications for this visit.     Allergies  Allergen Reactions  . Prednisone Anxiety    Induces her anxiety    Family History  Problem Relation Age of Onset  . Cancer Mother 30       breast  . Cancer Father        colon  . Diabetes Father     Social History   Socioeconomic History  . Marital status: Married    Spouse name: Not on file  . Number of children: Not on file  . Years of education: Not on file  . Highest education level: Not on file  Occupational History  . Not on file  Social Needs  . Financial resource strain: Not on file  . Food insecurity    Worry: Not on file    Inability: Not on file  . Transportation needs    Medical: Not on file    Non-medical: Not on file  Tobacco Use  . Smoking status:  Never Smoker  . Smokeless tobacco: Never Used  Substance and Sexual Activity  . Alcohol use: No  . Drug use: No  . Sexual activity: Yes    Partners: Male    Birth control/protection: None  Lifestyle  . Physical activity    Days per week: Not on file    Minutes per session: Not on file  . Stress: Not on file  Relationships  . Social Herbalist on phone: Not on file    Gets together: Not on file    Attends religious service: Not on file    Active member of club or organization: Not on file    Attends meetings of clubs or organizations: Not on file    Relationship status: Not on file  . Intimate partner violence    Fear of current or ex partner: Not on file    Emotionally abused: Not on file    Physically abused: Not on file    Forced sexual activity: Not on file  Other Topics Concern  . Not on file  Social History Narrative   Moved here from [place] last year for her husband's job.   3 adopted children ages 52, 41, 80.  Works at a daycare.     Constitutional: Denies fever, malaise, fatigue, headache or abrupt weight changes.  HEENT: Denies eye pain, eye redness, ear pain, ringing in the ears, wax buildup, runny nose, nasal congestion, bloody nose, or sore throat. Respiratory: Denies difficulty breathing, shortness of breath, cough or sputum production.   Cardiovascular: Denies chest pain, chest tightness, palpitations or swelling in the hands or feet.  Gastrointestinal: Denies abdominal pain, bloating, constipation, diarrhea or blood in the stool.  GU: Pt reports irregular periods. Denies urgency, frequency, pain with urination, burning sensation, blood in urine, odor or discharge. Musculoskeletal: Denies decrease in range of motion, difficulty with gait, muscle pain or joint pain and swelling.  Skin: Denies redness, rashes, lesions or ulcercations.  Neurological: Denies dizziness, difficulty with memory, difficulty with speech or problems with balance and  coordination.  Psych: Pt has a history of anxiety and depression. Denies SI/HI.  No other specific complaints in a complete review of systems (except as listed in HPI above).     Objective:   Physical Exam  BP 124/78   Pulse 88   Temp 98 F (36.7 C) (Temporal)   Ht 5' 1.75" (1.568 m)   Wt 176 lb (79.8 kg)   LMP 03/23/2019   SpO2 98%   BMI 32.45 kg/m  Wt Readings from Last 3 Encounters:  04/07/19 176 lb (79.8 kg)  10/14/17 158 lb (71.7 kg)  08/15/17 160 lb (72.6 kg)    General: Appears her stated age, obese, in NAD. Skin: Warm, dry and intact. No rashes, lesions or ulcerations noted. HEENT: Head: normal shape and size; Eyes: sclera white, no icterus, conjunctiva pink, PERRLA and EOMs intact; Ears: Tm's gray and intact, normal light reflex;  Neck:  Neck supple, trachea midline. No masses, lumps or thyromegaly present.  Cardiovascular: Normal rate and rhythm. S1,S2 noted.  No murmur, rubs or gallops noted. No JVD or BLE edema.  Pulmonary/Chest: Normal effort and positive vesicular breath sounds. No respiratory distress. No wheezes, rales or ronchi noted.  Abdomen: Soft and nontender. Normal bowel sounds. No distention or masses noted. Liver, spleen and kidneys non palpable. Pelvic: Normal female anatomy. Unable to advance the speculum into the vaginal vault secondary to pain. Musculoskeletal: Strength 5/5 BUE/BLE. No difficulty with gait.  Neurological: Alert and oriented. Cranial nerves II-XII grossly intact. Coordination normal.  Psychiatric: Mood and affect normal. Behavior is normal. Judgment and thought content normal.     BMET    Component Value Date/Time   NA 138 10/14/2017 1141   K 3.7 10/14/2017 1141   CL 105 10/14/2017 1141   CO2 26 10/14/2017 1141   GLUCOSE 76 10/14/2017 1141   BUN 18 10/14/2017 1141   CREATININE 0.77 10/14/2017 1141   CALCIUM 9.0 10/14/2017 1141    Lipid Panel     Component Value Date/Time   CHOL 222 (H) 10/14/2017 1141   TRIG 103.0  10/14/2017 1141   HDL 51.90 10/14/2017 1141   CHOLHDL 4 10/14/2017 1141   VLDL 20.6 10/14/2017 1141   LDLCALC 150 (H) 10/14/2017 1141    CBC    Component Value Date/Time   WBC 6.9 10/14/2017 1141   RBC 4.91 10/14/2017 1141   HGB 14.9 10/14/2017 1141   HCT 44.6 10/14/2017 1141   PLT 308.0 10/14/2017 1141   MCV 90.8 10/14/2017 1141   MCH 30.8 06/05/2012 0931   MCHC 33.3 10/14/2017 1141   RDW 13.0 10/14/2017 1141    Hgb A1C No results found for: HGBA1C  Assessment & Plan:   Preventative Health Maintenance:  She declines flu shot today Tetanus UTD Mammogram ordered, she will call [facility] to schedule Unable to obtain pap smear- too painful, has not had intercourse in 4+ years, has never had children Referral to GI for screening colonoscopy Encouraged her to consume a balanced diet and exercise regimen Advised her to see an eye doctor and dentist annually Will check CBC, CMET, TSH, Lipid, A1C  Irregular Period, Weight Gain:  Likely perimenopausal Will check TSH, FSH/LH  Will follow up after labs, return precautions discussed Webb Silversmith, NP

## 2019-04-07 NOTE — Assessment & Plan Note (Signed)
Controlled on Wellbutrin, Buspar and Xanax Support offered today CSA and UDS today

## 2019-04-08 LAB — PAIN MGMT, PROFILE 8 W/CONF, U
6 Acetylmorphine: NEGATIVE ng/mL
Alcohol Metabolites: NEGATIVE ng/mL (ref <500)
Amphetamines: NEGATIVE ng/mL
Benzodiazepines: NEGATIVE ng/mL
Buprenorphine, Urine: NEGATIVE ng/mL
Cocaine Metabolite: NEGATIVE ng/mL
Creatinine: 50.9 mg/dL
MDMA: NEGATIVE ng/mL
Marijuana Metabolite: NEGATIVE ng/mL
Opiates: NEGATIVE ng/mL
Oxidant: NEGATIVE ug/mL
Oxycodone: NEGATIVE ng/mL
pH: 5.6 (ref 4.5–9.0)

## 2019-04-20 ENCOUNTER — Encounter: Payer: Self-pay | Admitting: Internal Medicine

## 2019-04-20 DIAGNOSIS — E7800 Pure hypercholesterolemia, unspecified: Secondary | ICD-10-CM

## 2019-04-21 ENCOUNTER — Ambulatory Visit (INDEPENDENT_AMBULATORY_CARE_PROVIDER_SITE_OTHER): Payer: 59 | Admitting: Internal Medicine

## 2019-04-21 ENCOUNTER — Encounter: Payer: Self-pay | Admitting: *Deleted

## 2019-04-21 ENCOUNTER — Other Ambulatory Visit: Payer: Self-pay

## 2019-04-21 ENCOUNTER — Encounter: Payer: Self-pay | Admitting: Internal Medicine

## 2019-04-21 VITALS — BP 126/80 | HR 74 | Temp 98.1°F | Wt 176.0 lb

## 2019-04-21 DIAGNOSIS — S161XXA Strain of muscle, fascia and tendon at neck level, initial encounter: Secondary | ICD-10-CM | POA: Diagnosis not present

## 2019-04-21 MED ORDER — PRAVASTATIN SODIUM 10 MG PO TABS: 10.0000 mg | ORAL_TABLET | Freq: Every day | ORAL | 0 refills | Status: DC

## 2019-04-21 MED ORDER — NAPROXEN 500 MG PO TABS: 500.0000 mg | ORAL_TABLET | Freq: Two times a day (BID) | ORAL | 0 refills | Status: DC

## 2019-04-21 MED ORDER — METHOCARBAMOL 500 MG PO TABS: 500.0000 mg | ORAL_TABLET | Freq: Three times a day (TID) | ORAL | 0 refills | Status: DC | PRN

## 2019-04-21 NOTE — Patient Instructions (Signed)
Muscle Strain A muscle strain is an injury that happens when a muscle is stretched longer than normal. This can happen during a fall, sports, or lifting. This can tear some muscle fibers. Usually, recovery from muscle strain takes 1-2 weeks. Complete healing normally takes 5-6 weeks. This condition is first treated with PRICE therapy. This involves:  Protecting your muscle from being injured again.  Resting your injured muscle.  Icing your injured muscle.  Applying pressure (compression) to your injured muscle. This may be done with a splint or elastic bandage.  Raising (elevating) your injured muscle. Your doctor may also recommend medicine for pain. Follow these instructions at home: If you have a splint:  Wear the splint as told by your doctor. Take it off only as told by your doctor.  Loosen the splint if your fingers or toes tingle, get numb, or turn cold and blue.  Keep the splint clean.  If the splint is not waterproof: ? Do not let it get wet. ? Cover it with a watertight covering when you take a bath or a shower. Managing pain, stiffness, and swelling   If directed, put ice on your injured area. ? If you have a removable splint, take it off as told by your doctor. ? Put ice in a plastic bag. ? Place a towel between your skin and the bag. ? Leave the ice on for 20 minutes, 2-3 times a day.  Move your fingers or toes often. This helps to avoid stiffness and lessen swelling.  Raise your injured area above the level of your heart while you are sitting or lying down.  Wear an elastic bandage as told by your doctor. Make sure it is not too tight. General instructions  Take over-the-counter and prescription medicines only as told by your doctor.  Limit your activity. Rest your injured muscle as told by your doctor. Your doctor may say that gentle movements are okay.  If physical therapy was prescribed, do exercises as told by your doctor.  Do not put pressure on any  part of the splint until it is fully hardened. This may take many hours.  Do not use any products that contain nicotine or tobacco, such as cigarettes and e-cigarettes. These can delay bone healing. If you need help quitting, ask your doctor.  Warm up before you exercise. This helps to prevent more muscle strains.  Ask your doctor when it is safe to drive if you have a splint.  Keep all follow-up visits as told by your doctor. This is important. Contact a doctor if:  You have more pain or swelling in your injured area. Get help right away if:  You have any of these problems in your injured area: ? You have numbness. ? You have tingling. ? You lose a lot of strength. Summary  A muscle strain is an injury that happens when a muscle is stretched longer than normal.  This condition is first treated with PRICE therapy. This includes protecting, resting, icing, adding pressure, and raising your injury.  Limit your activity. Rest your injured muscle as told by your doctor. Your doctor may say that gentle movements are okay.  Warm up before you exercise. This helps to prevent more muscle strains. This information is not intended to replace advice given to you by your health care provider. Make sure you discuss any questions you have with your health care provider. Document Released: 03/13/2008 Document Revised: 07/31/2018 Document Reviewed: 07/11/2016 Elsevier Patient Education  2020 Elsevier Inc.  

## 2019-04-21 NOTE — Progress Notes (Signed)
Subjective:    Patient ID: Regina Salazar, female    DOB: March 26, 1971, 48 y.o.   MRN: [id number]  HPI  Pt presents to the clinic today with c/o neck pain. This started yesterday in the morning and progressed throughout the day. It has gotten worse today. The pain is intermittent and sharp/pressure in nature. It reaches 8/10. It worse with movement. She denies numbness, tingling or weakness of the left arm. 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 Rest and heat seems to make it better. She has tried Tylenol and Ibuprofen but minimal relief. She denies any trauma/falls. She has a history of tenosynovitis of left elbow which she wears a brace with good relief.   Review of Systems      Past Medical History:  Diagnosis Date  . Depression     Current Outpatient Medications  Medication Sig Dispense Refill  . ALPRAZolam (XANAX) 0.5 MG tablet Take 1 tablet (0.5 mg total) by mouth daily as needed for anxiety. 1/2 to 1 tablet up to three times as needed for panic attacks and insomnia 30 tablet 0  . buPROPion (WELLBUTRIN XL) 150 MG 24 hr tablet Take 1 tablet (150 mg total) by mouth daily. 90 tablet 1  . busPIRone (BUSPAR) 15 MG tablet Take 1 tablet (15 mg total) by mouth 3 (three) times daily. MUST SCHEDULE PHYSICAL EXAM 270 tablet 1  . pravastatin (PRAVACHOL) 10 MG tablet Take 1 tablet (10 mg total) by mouth daily. 90 tablet 0   No current facility-administered medications for this visit.     Allergies  Allergen Reactions  . Prednisone Anxiety    Induces her anxiety    Family History  Problem Relation Age of Onset  . Cancer Mother 82       breast  . Cancer Father        colon  . Diabetes Father     Social History   Socioeconomic History  . Marital status: Married    Spouse name: Not on file  . Number of children: Not on file  . Years of education: Not on file  . Highest education level: Not on file  Occupational History  . Not on file  Social Needs  . Financial  resource strain: Not on file  . Food insecurity    Worry: Not on file    Inability: Not on file  . Transportation needs    Medical: Not on file    Non-medical: Not on file  Tobacco Use  . Smoking status: Never Smoker  . Smokeless tobacco: Never Used  Substance and Sexual Activity  . Alcohol use: No  . Drug use: No  . Sexual activity: Yes    Partners: Male    Birth control/protection: None  Lifestyle  . Physical activity    Days per week: Not on file    Minutes per session: Not on file  . Stress: Not on file  Relationships  . Social Herbalist on phone: Not on file    Gets together: Not on file    Attends religious service: Not on file    Active member of club or organization: Not on file    Attends meetings of clubs or organizations: Not on file    Relationship status: Not on file  . Intimate partner violence    Fear of current or ex partner: Not on file    Emotionally abused: Not on file    Physically abused: Not on file  Forced sexual activity: Not on file  Other Topics Concern  . Not on file  Social History Narrative   Moved here from [place] last year for her husband's job.   3 adopted children ages 59, 75, 62.      Works at a daycare.     Constitutional: Denies fever, malaise, fatigue, headache or abrupt weight changes.  Respiratory: Denies difficulty breathing, shortness of breath, cough or sputum production.   Cardiovascular: Denies chest pain, chest tightness, palpitations or swelling in the hands or feet.  Musculoskeletal: Pt reports neck pain. Denies decrease in range of motion, difficulty with gait, or joint pain and swelling.  Skin: Denies redness, rashes, lesions or ulcercations.  Neurological: Denies numbness, tingling or difficulty with coordination.   No other specific complaints in a complete review of systems (except as listed in HPI above).  Objective:   Physical Exam  BP 126/80   Pulse 74   Temp 98.1 F (36.7 C) (Temporal)    Wt 176 lb (79.8 kg)   LMP 03/23/2019   SpO2 98%   BMI 32.45 kg/m   Wt Readings from Last 3 Encounters:  04/07/19 176 lb (79.8 kg)  10/14/17 158 lb (71.7 kg)  08/15/17 160 lb (72.6 kg)    General: Appears her stated age, obese, in NAD. Skin: Warm, dry and intact. No rashes, lesions or ulcerations noted. Cardiovascular: Normal rate and rhythm. S1,S2 noted.  No murmur, rubs or gallops noted.  Pulmonary/Chest: Normal effort and positive vesicular breath sounds. No respiratory distress. No wheezes, rales or ronchi noted.  Musculoskeletal: Normal flexion, extension and rotation of the cervical spine. Bony tenderness noted with the left paracervical muscles.  Pain with abduction of the left shoulder but normal adduction, internal and external rotation. Strength 5/5 BUE. Hand grips equal.  Neurological: Alert and oriented.  Coordination normal.     BMET    Component Value Date/Time   NA 135 04/07/2019 1005   K 4.0 04/07/2019 1005   CL 103 04/07/2019 1005   CO2 23 04/07/2019 1005   GLUCOSE 107 (H) 04/07/2019 1005   BUN 13 04/07/2019 1005   CREATININE 0.75 04/07/2019 1005   CALCIUM 9.1 04/07/2019 1005    Lipid Panel     Component Value Date/Time   CHOL 247 (H) 04/07/2019 1005   TRIG 136.0 04/07/2019 1005   HDL 46.20 04/07/2019 1005   CHOLHDL 5 04/07/2019 1005   VLDL 27.2 04/07/2019 1005   LDLCALC 174 (H) 04/07/2019 1005    CBC    Component Value Date/Time   WBC 7.0 04/07/2019 1005   RBC 4.82 04/07/2019 1005   HGB 14.7 04/07/2019 1005   HCT 43.6 04/07/2019 1005   PLT 336.0 04/07/2019 1005   MCV 90.4 04/07/2019 1005   MCH 30.8 06/05/2012 0931   MCHC 33.7 04/07/2019 1005   RDW 12.7 04/07/2019 1005    Hgb A1C Lab Results  Component Value Date   HGBA1C 6.2 04/07/2019            Assessment & Plan:  Muscle Strain of Neck:  RX for Naproxen 500 mg BID- avoid other NSAID's OTC RX for Methocarbamol 500 mg TID prn - sedation precaution given Advised to continue  symptomatic treatment- stretches and apply heat Work note provided  Return precautions discussed Webb Silversmith, NP

## 2019-04-28 ENCOUNTER — Telehealth: Payer: Self-pay | Admitting: Gastroenterology

## 2019-04-28 NOTE — Telephone Encounter (Signed)
Pt left vm to schedule a colonoscopy she states she will be at lunch until 1:30 Pm then will get of from work at [employer]

## 2019-04-29 ENCOUNTER — Other Ambulatory Visit: Payer: Self-pay

## 2019-04-29 DIAGNOSIS — Z8371 Family history of colonic polyps: Secondary | ICD-10-CM

## 2019-04-29 DIAGNOSIS — Z800 Family history of malignant neoplasm of digestive organs: Secondary | ICD-10-CM

## 2019-04-29 DIAGNOSIS — Z1211 Encounter for screening for malignant neoplasm of colon: Secondary | ICD-10-CM

## 2019-04-29 MED ORDER — NA SULFATE-K SULFATE-MG SULF 17.5-3.13-1.6 GM/177ML PO SOLN: 1.0000 | Freq: Once | ORAL | 0 refills | Status: AC

## 2019-04-29 NOTE — Telephone Encounter (Signed)
Gastroenterology Pre-Procedure Review  Request Date: Fri. 05/08/19 Requesting Physician: Dr. Bonna Gains  PATIENT REVIEW QUESTIONS: The patient responded to the following health history questions as indicated:    1. Are you having any GI issues? no 2. Do you have a personal history of Polyps? no 3. Do you have a family history of Colon Cancer or Polyps? yes (Brother and Sister polyps, Father Colon Cancer) 4. Diabetes Mellitus? no 5. Joint replacements in the past 12 months?no 6. Major health problems in the past 3 months?no 7. Any artificial heart valves, MVP, or defibrillator?no    MEDICATIONS & ALLERGIES:    Patient reports the following regarding taking any anticoagulation/antiplatelet therapy:   Plavix, Coumadin, Eliquis, Xarelto, Lovenox, Pradaxa, Brilinta, or Effient? no Aspirin? no  Patient confirms/reports the following medications:  Current Outpatient Medications  Medication Sig Dispense Refill  . ALPRAZolam (XANAX) 0.5 MG tablet Take 1 tablet (0.5 mg total) by mouth daily as needed for anxiety. 1/2 to 1 tablet up to three times as needed for panic attacks and insomnia 30 tablet 0  . buPROPion (WELLBUTRIN XL) 150 MG 24 hr tablet Take 1 tablet (150 mg total) by mouth daily. 90 tablet 1  . busPIRone (BUSPAR) 15 MG tablet Take 1 tablet (15 mg total) by mouth 3 (three) times daily. MUST SCHEDULE PHYSICAL EXAM 270 tablet 1  . methocarbamol (ROBAXIN) 500 MG tablet Take 1 tablet (500 mg total) by mouth every 8 (eight) hours as needed for muscle spasms. 20 tablet 0  . naproxen (NAPROSYN) 500 MG tablet Take 1 tablet (500 mg total) by mouth 2 (two) times daily with a meal. 15 tablet 0  . pravastatin (PRAVACHOL) 10 MG tablet Take 1 tablet (10 mg total) by mouth daily. 90 tablet 0   No current facility-administered medications for this visit.     Patient confirms/reports the following allergies:  Allergies  Allergen Reactions  . Prednisone Anxiety    Induces her anxiety    No  orders of the defined types were placed in this encounter.   AUTHORIZATION INFORMATION Primary Insurance: 1D#: Group #:  Secondary Insurance: 1D#: Group #:  SCHEDULE INFORMATION: Date: Friday 05/08/19 Time: Location:[facility]

## 2019-04-29 NOTE — Telephone Encounter (Signed)
PT left vm to schedule a colonoscopy she states this is her 2nd phone call

## 2019-05-05 ENCOUNTER — Other Ambulatory Visit: Payer: Self-pay

## 2019-05-05 ENCOUNTER — Other Ambulatory Visit
Admission: RE | Admit: 2019-05-05 | Discharge: 2019-05-05 | Disposition: A | Discharge status: Home / Self Care | Payer: 59 | Source: Ambulatory Visit | Attending: Gastroenterology | Admitting: Gastroenterology

## 2019-05-05 DIAGNOSIS — Z20828 Contact with and (suspected) exposure to other viral communicable diseases: Secondary | ICD-10-CM | POA: Diagnosis not present

## 2019-05-05 DIAGNOSIS — Z01812 Encounter for preprocedural laboratory examination: Secondary | ICD-10-CM | POA: Diagnosis present

## 2019-05-05 LAB — SARS CORONAVIRUS 2 (TAT 6-24 HRS): SARS Coronavirus 2: NEGATIVE

## 2019-05-08 ENCOUNTER — Encounter
Admission: RE | Disposition: A | Discharge status: Home / Self Care | Payer: Self-pay | Source: Home / Self Care | Attending: Gastroenterology

## 2019-05-08 ENCOUNTER — Other Ambulatory Visit: Payer: Self-pay

## 2019-05-08 ENCOUNTER — Ambulatory Visit: Payer: 59 | Admitting: Anesthesiology

## 2019-05-08 ENCOUNTER — Ambulatory Visit
Admission: RE | Admit: 2019-05-08 | Discharge: 2019-05-08 | Disposition: A | Discharge status: Home / Self Care | Payer: 59 | Attending: Gastroenterology | Admitting: Gastroenterology

## 2019-05-08 DIAGNOSIS — Z8371 Family history of colonic polyps: Secondary | ICD-10-CM

## 2019-05-08 DIAGNOSIS — Z79899 Other long term (current) drug therapy: Secondary | ICD-10-CM | POA: Diagnosis not present

## 2019-05-08 DIAGNOSIS — K635 Polyp of colon: Secondary | ICD-10-CM

## 2019-05-08 DIAGNOSIS — F329 Major depressive disorder, single episode, unspecified: Secondary | ICD-10-CM | POA: Insufficient documentation

## 2019-05-08 DIAGNOSIS — Z800 Family history of malignant neoplasm of digestive organs: Secondary | ICD-10-CM | POA: Diagnosis not present

## 2019-05-08 DIAGNOSIS — Z1211 Encounter for screening for malignant neoplasm of colon: Secondary | ICD-10-CM | POA: Diagnosis present

## 2019-05-08 DIAGNOSIS — Z791 Long term (current) use of non-steroidal anti-inflammatories (NSAID): Secondary | ICD-10-CM | POA: Diagnosis not present

## 2019-05-08 DIAGNOSIS — D123 Benign neoplasm of transverse colon: Secondary | ICD-10-CM | POA: Insufficient documentation

## 2019-05-08 DIAGNOSIS — Z83719 Family history of colon polyps, unspecified: Secondary | ICD-10-CM

## 2019-05-08 DIAGNOSIS — F419 Anxiety disorder, unspecified: Secondary | ICD-10-CM | POA: Diagnosis not present

## 2019-05-08 HISTORY — PX: COLONOSCOPY WITH PROPOFOL: SHX5780

## 2019-05-08 LAB — POCT PREGNANCY, URINE: Preg Test, Ur: NEGATIVE

## 2019-05-08 SURGERY — COLONOSCOPY WITH PROPOFOL
Anesthesia: General

## 2019-05-08 MED ORDER — FENTANYL CITRATE (PF) 100 MCG/2ML IJ SOLN
INTRAMUSCULAR | Status: DC | PRN
  Administered 2019-05-08: 50 ug via INTRAVENOUS

## 2019-05-08 MED ORDER — FENTANYL CITRATE (PF) 100 MCG/2ML IJ SOLN
INTRAMUSCULAR | Status: AC
  Filled 2019-05-08: qty 2

## 2019-05-08 MED ORDER — PROPOFOL 500 MG/50ML IV EMUL
INTRAVENOUS | Status: DC | PRN
  Administered 2019-05-08: 150 ug/kg/min via INTRAVENOUS

## 2019-05-08 MED ORDER — MIDAZOLAM HCL 2 MG/2ML IJ SOLN
INTRAMUSCULAR | Status: DC | PRN
  Administered 2019-05-08 (×2): 1 mg via INTRAVENOUS

## 2019-05-08 MED ORDER — SODIUM CHLORIDE 0.9 % IV SOLN
INTRAVENOUS | Status: DC
  Administered 2019-05-08 (×2): via INTRAVENOUS

## 2019-05-08 MED ORDER — PROPOFOL 10 MG/ML IV BOLUS
INTRAVENOUS | Status: DC | PRN
  Administered 2019-05-08: 50 mg via INTRAVENOUS

## 2019-05-08 MED ORDER — PROPOFOL 500 MG/50ML IV EMUL
INTRAVENOUS | Status: AC
  Filled 2019-05-08: qty 50

## 2019-05-08 MED ORDER — MIDAZOLAM HCL 2 MG/2ML IJ SOLN
INTRAMUSCULAR | Status: AC
  Filled 2019-05-08: qty 2

## 2019-05-08 NOTE — Transfer of Care (Signed)
Immediate Anesthesia Transfer of Care Note  Patient: Regina Salazar  Procedure(s) Performed: COLONOSCOPY WITH PROPOFOL (N/A )  Patient Location: PACU  Anesthesia Type:General  Level of Consciousness: awake and alert   Airway & Oxygen Therapy: Patient Spontanous Breathing and Patient connected to nasal cannula oxygen  Post-op Assessment: Report given to RN and Post -op Vital signs reviewed and stable  Post vital signs: Reviewed and stable  Last Vitals:  Vitals Value Taken Time  BP    Temp 36.5 C 05/08/19 1217  Pulse 78 05/08/19 1220  Resp 18 05/08/19 1220  SpO2 96 % 05/08/19 1220  Vitals shown include unvalidated device data.  Last Pain:  Vitals:   05/08/19 1217  TempSrc: Temporal  PainSc: Asleep         Complications: No apparent anesthesia complications

## 2019-05-08 NOTE — Op Note (Signed)
[facility] Gastroenterology Patient Name: Regina Salazar Procedure Date: 05/08/2019 11:43 AM MRN: [id number] Account #: [id number] Date of Birth: 1971-01-20 Admit Type: Outpatient Age: 48 Room: [facility] ENDO ROOM 2 Gender: Female Note Status: Finalized Procedure:             Colonoscopy Indications:           Screening in patient at increased risk: Family history                         of 1st-degree relative with colorectal cancer Providers:             Aili Casillas B. Bonna Gains MD, MD Referring MD:          Jearld Fenton (Referring MD) Medicines:             Monitored Anesthesia Care Complications:         No immediate complications. Procedure:             Pre-Anesthesia Assessment:                        - ASA Grade Assessment: II - A patient with mild                         systemic disease.                        - Prior to the procedure, a History and Physical was                         performed, and patient medications, allergies and                         sensitivities were reviewed. The patient's tolerance                         of previous anesthesia was reviewed.                        - The risks and benefits of the procedure and the                         sedation options and risks were discussed with the                         patient. All questions were answered and informed                         consent was obtained.                        - Patient identification and proposed procedure were                         verified prior to the procedure by the physician, the                         nurse, the anesthesiologist, the anesthetist and the  technician. The procedure was verified in the                         procedure room.                        After obtaining informed consent, the colonoscope was                         passed under direct vision. Throughout the procedure,                         the patient's  blood pressure, pulse, and oxygen                         saturations were monitored continuously. The                         Colonoscope was introduced through the anus and                         advanced to the the cecum, identified by appendiceal                         orifice and ileocecal valve. The colonoscopy was                         performed with ease. The patient tolerated the                         procedure well. The quality of the bowel preparation                         was good. Findings:      The perianal and digital rectal examinations were normal.      A 7 mm polyp was found in the transverse colon. The polyp was flat. The       polyp was removed with a cold snare. Resection and retrieval were       complete.      The exam was otherwise without abnormality.      The rectum, sigmoid colon, descending colon, transverse colon, ascending       colon and cecum appeared normal.      The retroflexed view of the distal rectum and anal verge was normal and       showed no anal or rectal abnormalities. Impression:            - One 7 mm polyp in the transverse colon, removed with                         a cold snare. Resected and retrieved.                        - The examination was otherwise normal.                        - The rectum, sigmoid colon, descending colon,                         transverse colon,  ascending colon and cecum are normal.                        - The distal rectum and anal verge are normal on                         retroflexion view. Recommendation:        - Discharge patient to home (with escort).                        - Advance diet as tolerated.                        - Continue present medications.                        - Await pathology results.                        - Repeat colonoscopy in 5 years.                        - The findings and recommendations were discussed with                         the patient.                         - The findings and recommendations were discussed with                         the patient's family.                        - Return to primary care physician as previously                         scheduled. Procedure Code(s):     --- Professional ---                        [phone], Colonoscopy, flexible; with removal of                         tumor(s), polyp(s), or other lesion(s) by snare                         technique Diagnosis Code(s):     --- Professional ---                        Z80.0, Family history of malignant neoplasm of                         digestive organs                        K63.5, Polyp of colon CPT copyright 2019 American Medical Association. All rights reserved. The codes documented in this report are preliminary and upon coder review may  be revised to meet current compliance requirements.  Vonda Antigua, MD Margretta Sidle B. Bonna Gains MD, MD 05/08/2019 12:14:52 PM This report has been signed electronically. Number of Addenda:  0 Note Initiated On: 05/08/2019 11:43 AM Scope Withdrawal Time: 0 hours 10 minutes 19 seconds  Total Procedure Duration: 0 hours 15 minutes 36 seconds  Estimated Blood Loss:  Estimated blood loss: none.      [facility]

## 2019-05-08 NOTE — Anesthesia Preprocedure Evaluation (Signed)
Anesthesia Evaluation  Patient identified by MRN, date of birth, ID band Patient awake    Reviewed: Allergy & Precautions, H&P , NPO status , Patient's Chart, lab work & pertinent test results, reviewed documented beta blocker date and time   Airway Mallampati: II   Neck ROM: full    Dental  (+) Poor Dentition   Pulmonary neg pulmonary ROS,    Pulmonary exam normal        Cardiovascular Exercise Tolerance: Good negative cardio ROS Normal cardiovascular exam Rhythm:regular Rate:Normal     Neuro/Psych Anxiety Depression negative neurological ROS  negative psych ROS   GI/Hepatic negative GI ROS, Neg liver ROS,   Endo/Other  negative endocrine ROS  Renal/GU negative Renal ROS  negative genitourinary   Musculoskeletal   Abdominal   Peds  Hematology negative hematology ROS (+)   Anesthesia Other Findings Past Medical History: No date: Depression Past Surgical History: 1980: cystectomy on writst 06/05/2012: EXAMINATION UNDER ANESTHESIA     Comment:  Procedure: EXAM UNDER ANESTHESIA;  Surgeon: Donnamae Jude, MD;  Location: [facility];  Service: Gynecology;                Laterality: N/A;  pap testing BMI    Body Mass Index: 31.09 kg/m     Reproductive/Obstetrics negative OB ROS                             Anesthesia Physical Anesthesia Plan  ASA: II  Anesthesia Plan: General   Post-op Pain Management:    Induction:   PONV Risk Score and Plan:   Airway Management Planned:   Additional Equipment:   Intra-op Plan:   Post-operative Plan:   Informed Consent: I have reviewed the patients History and Physical, chart, labs and discussed the procedure including the risks, benefits and alternatives for the proposed anesthesia with the patient or authorized representative who has indicated his/her understanding and acceptance.     Dental Advisory Given  Plan Discussed  with: CRNA  Anesthesia Plan Comments:         Anesthesia Quick Evaluation

## 2019-05-08 NOTE — Anesthesia Post-op Follow-up Note (Signed)
Anesthesia QCDR form completed.        

## 2019-05-08 NOTE — Anesthesia Procedure Notes (Signed)
Date/Time: 05/08/2019 11:45 AM Performed by: Allean Found, CRNA Oxygen Delivery Method: Nasal cannula Placement Confirmation: positive ETCO2

## 2019-05-08 NOTE — H&P (Signed)
Vonda Antigua, MD [address] [place], [place], [place], [address] [address], [place], [place], [place], [address] Phone: [phone]  Fax: [phone]  Primary Care Physician:  Jearld Fenton, NP   Pre-Procedure History & Physical: HPI:  Regina Salazar is a 48 y.o. female is here for a colonoscopy.   Past Medical History:  Diagnosis Date  . Depression     Past Surgical History:  Procedure Laterality Date  . cystectomy on writst  1980  . EXAMINATION UNDER ANESTHESIA  06/05/2012   Procedure: EXAM UNDER ANESTHESIA;  Surgeon: Donnamae Jude, MD;  Location: [facility];  Service: Gynecology;  Laterality: N/A;  pap testing    Prior to Admission medications   Medication Sig Start Date End Date Taking? Authorizing Provider  ALPRAZolam Duanne Moron) 0.5 MG tablet Take 1 tablet (0.5 mg total) by mouth daily as needed for anxiety. 1/2 to 1 tablet up to three times as needed for panic attacks and insomnia 11/18/18  Yes Baity, Coralie Keens, NP  buPROPion (WELLBUTRIN XL) 150 MG 24 hr tablet Take 1 tablet (150 mg total) by mouth daily. 11/18/18  Yes Baity, Coralie Keens, NP  busPIRone (BUSPAR) 15 MG tablet Take 1 tablet (15 mg total) by mouth 3 (three) times daily. MUST SCHEDULE PHYSICAL EXAM 11/18/18  Yes Baity, Coralie Keens, NP  pravastatin (PRAVACHOL) 10 MG tablet Take 1 tablet (10 mg total) by mouth daily. 04/21/19  Yes Baity, Coralie Keens, NP  methocarbamol (ROBAXIN) 500 MG tablet Take 1 tablet (500 mg total) by mouth every 8 (eight) hours as needed for muscle spasms. Patient not taking: Reported on 05/08/2019 04/21/19   Jearld Fenton, NP  naproxen (NAPROSYN) 500 MG tablet Take 1 tablet (500 mg total) by mouth 2 (two) times daily with a meal. Patient not taking: Reported on 05/08/2019 04/21/19   Jearld Fenton, NP    Allergies as of 04/29/2019 - Review Complete 04/21/2019  Allergen Reaction Noted  . Prednisone Anxiety 06/09/2014    Family History  Problem Relation Age of Onset  . Cancer Mother 62     breast  . Cancer Father        colon  . Diabetes Father     Social History   Socioeconomic History  . Marital status: Married    Spouse name: Not on file  . Number of children: Not on file  . Years of education: Not on file  . Highest education level: Not on file  Occupational History  . Not on file  Social Needs  . Financial resource strain: Not on file  . Food insecurity    Worry: Not on file    Inability: Not on file  . Transportation needs    Medical: Not on file    Non-medical: Not on file  Tobacco Use  . Smoking status: Never Smoker  . Smokeless tobacco: Never Used  Substance and Sexual Activity  . Alcohol use: No  . Drug use: No  . Sexual activity: Yes    Partners: Male    Birth control/protection: None  Lifestyle  . Physical activity    Days per week: Not on file    Minutes per session: Not on file  . Stress: Not on file  Relationships  . Social Herbalist on phone: Not on file    Gets together: Not on file    Attends religious service: Not on file    Active member of club or organization: Not on file  Attends meetings of clubs or organizations: Not on file    Relationship status: Not on file  . Intimate partner violence    Fear of current or ex partner: Not on file    Emotionally abused: Not on file    Physically abused: Not on file    Forced sexual activity: Not on file  Other Topics Concern  . Not on file  Social History Narrative   Moved here from [place] last year for her husband's job.   3 adopted children ages 63, 50, 68.      Works at a daycare.    Review of Systems: See HPI, otherwise negative ROS  Physical Exam: BP (!) 126/99   Pulse 87   Temp 99.6 F (37.6 C) (Temporal)   Resp 17   Ht 5\' 2"  (1.575 m)   Wt 77.1 kg   SpO2 95%   BMI 31.09 kg/m  General:   Alert,  pleasant and cooperative in NAD Head:  Normocephalic and atraumatic. Neck:  Supple; no masses or thyromegaly. Lungs:  Clear throughout to  auscultation, normal respiratory effort.    Heart:  +S1, +S2, Regular rate and rhythm, No edema. Abdomen:  Soft, nontender and nondistended. Normal bowel sounds, without guarding, and without rebound.   Neurologic:  Alert and  oriented x4;  grossly normal neurologically.  Impression/Plan: Regina Salazar is here for a colonoscopy to be performed for average risk screening.  Risks, benefits, limitations, and alternatives regarding  colonoscopy have been reviewed with the patient.  Questions have been answered.  All parties agreeable.   Virgel Manifold, MD  05/08/2019, 10:40 AM

## 2019-05-11 ENCOUNTER — Encounter: Payer: Self-pay | Admitting: Gastroenterology

## 2019-05-11 LAB — SURGICAL PATHOLOGY

## 2019-05-12 ENCOUNTER — Other Ambulatory Visit: Payer: Self-pay | Admitting: Internal Medicine

## 2019-05-13 ENCOUNTER — Encounter: Payer: Self-pay | Admitting: Gastroenterology

## 2019-05-19 NOTE — Anesthesia Postprocedure Evaluation (Signed)
Anesthesia Post Note  Patient: Alex Moshier  Procedure(s) Performed: COLONOSCOPY WITH PROPOFOL (N/A )  Patient location during evaluation: PACU Anesthesia Type: General Level of consciousness: awake and alert Pain management: pain level controlled Vital Signs Assessment: post-procedure vital signs reviewed and stable Respiratory status: spontaneous breathing, nonlabored ventilation, respiratory function stable and patient connected to nasal cannula oxygen Cardiovascular status: blood pressure returned to baseline and stable Postop Assessment: no apparent nausea or vomiting Anesthetic complications: no     Last Vitals:  Vitals:   05/08/19 1237 05/08/19 1247  BP: 102/89 102/85  Pulse:    Resp:    Temp:    SpO2:      Last Pain:  Vitals:   05/09/19 0908  TempSrc:   PainSc: 0-No pain                 Molli Barrows

## 2019-05-29 ENCOUNTER — Other Ambulatory Visit: Payer: Self-pay | Admitting: Internal Medicine

## 2019-05-29 ENCOUNTER — Other Ambulatory Visit: Payer: Self-pay

## 2019-05-29 ENCOUNTER — Ambulatory Visit
Admission: RE | Admit: 2019-05-29 | Discharge: 2019-05-29 | Disposition: A | Discharge status: Home / Self Care | Payer: 59 | Source: Ambulatory Visit | Attending: Internal Medicine | Admitting: Internal Medicine

## 2019-05-29 DIAGNOSIS — Z1231 Encounter for screening mammogram for malignant neoplasm of breast: Secondary | ICD-10-CM

## 2019-06-02 ENCOUNTER — Other Ambulatory Visit: Payer: Self-pay | Admitting: Internal Medicine

## 2019-06-02 DIAGNOSIS — R928 Other abnormal and inconclusive findings on diagnostic imaging of breast: Secondary | ICD-10-CM

## 2019-06-09 ENCOUNTER — Other Ambulatory Visit: Payer: Self-pay

## 2019-06-09 ENCOUNTER — Ambulatory Visit: Payer: 59

## 2019-06-09 ENCOUNTER — Ambulatory Visit
Admission: RE | Admit: 2019-06-09 | Discharge: 2019-06-09 | Disposition: A | Discharge status: Home / Self Care | Payer: 59 | Source: Ambulatory Visit | Attending: Internal Medicine | Admitting: Internal Medicine

## 2019-06-09 DIAGNOSIS — R928 Other abnormal and inconclusive findings on diagnostic imaging of breast: Secondary | ICD-10-CM

## 2019-06-14 ENCOUNTER — Other Ambulatory Visit: Payer: Self-pay | Admitting: Internal Medicine

## 2019-07-24 ENCOUNTER — Other Ambulatory Visit (INDEPENDENT_AMBULATORY_CARE_PROVIDER_SITE_OTHER): Payer: 59

## 2019-07-24 ENCOUNTER — Other Ambulatory Visit: Payer: Self-pay

## 2019-07-24 DIAGNOSIS — E7800 Pure hypercholesterolemia, unspecified: Secondary | ICD-10-CM | POA: Diagnosis not present

## 2019-07-24 LAB — COMPREHENSIVE METABOLIC PANEL
ALT: 24 U/L (ref 0–35)
AST: 22 U/L (ref 0–37)
Albumin: 4.3 g/dL (ref 3.5–5.2)
Alkaline Phosphatase: 72 U/L (ref 39–117)
BUN: 18 mg/dL (ref 6–23)
CO2: 24 mEq/L (ref 19–32)
Calcium: 9 mg/dL (ref 8.4–10.5)
Chloride: 104 mEq/L (ref 96–112)
Creatinine, Ser: 0.9 mg/dL (ref 0.40–1.20)
GFR: 66.29 mL/min (ref >60.00)
Glucose, Bld: 96 mg/dL (ref 70–99)
Potassium: 4.1 mEq/L (ref 3.5–5.1)
Sodium: 137 mEq/L (ref 135–145)
Total Bilirubin: 0.6 mg/dL (ref 0.2–1.2)
Total Protein: 7.4 g/dL (ref 6.0–8.3)

## 2019-07-24 LAB — LIPID PANEL
Cholesterol: 218 mg/dL — ABNORMAL HIGH (ref 0–200)
HDL: 49 mg/dL (ref >39.00)
LDL Cholesterol: 145 mg/dL — ABNORMAL HIGH (ref 0–99)
NonHDL: 169.11
Total CHOL/HDL Ratio: 4
Triglycerides: 122 mg/dL (ref 0.0–149.0)
VLDL: 24.4 mg/dL (ref 0.0–40.0)

## 2019-07-28 ENCOUNTER — Telehealth: Payer: Self-pay

## 2019-07-28 MED ORDER — PRAVASTATIN SODIUM 20 MG PO TABS: 20.0000 mg | ORAL_TABLET | Freq: Every day | ORAL | 0 refills | Status: DC

## 2019-07-28 NOTE — Telephone Encounter (Signed)
Patient returned phone call in regards to labs results. Patient notified of these results and Regina's recommendations of increasing Pravastatin. Patient verbalized understanding. Patient would like rx sent in to [facility] on [address] appt has been scheduled for 3 months.

## 2019-07-28 NOTE — Addendum Note (Signed)
Addended by: Lurlean Nanny on: 07/28/2019 11:33 AM   Modules accepted: Orders

## 2019-07-28 NOTE — Addendum Note (Signed)
Addended by: Lurlean Nanny on: 07/28/2019 11:32 AM   Modules accepted: Orders

## 2019-09-06 ENCOUNTER — Other Ambulatory Visit: Payer: Self-pay | Admitting: Internal Medicine

## 2019-09-06 ENCOUNTER — Encounter: Payer: Self-pay | Admitting: Internal Medicine

## 2019-09-07 ENCOUNTER — Telehealth: Payer: Self-pay

## 2019-09-07 MED ORDER — BUSPIRONE HCL 15 MG PO TABS: 15.0000 mg | ORAL_TABLET | Freq: Three times a day (TID) | ORAL | 0 refills | Status: DC

## 2019-09-07 NOTE — Telephone Encounter (Signed)
This is duplicate request, refilled earlier today, will wait to deny tomorrow so it will not confuse today's approval

## 2019-09-07 NOTE — Telephone Encounter (Signed)
Josephine with [facility] left v/m requesting cb with ref [id number]  To have Dr Silvio Pate' name NPI; I spoke with Otila Kluver pharmacist at [facility] and gave Dr Viviana Simpler as supervising physician and NPI; nothing further needed and will get med out to pt.

## 2019-10-03 ENCOUNTER — Other Ambulatory Visit: Payer: Self-pay | Admitting: Internal Medicine

## 2019-10-26 ENCOUNTER — Other Ambulatory Visit: Payer: Self-pay

## 2019-10-26 ENCOUNTER — Other Ambulatory Visit (INDEPENDENT_AMBULATORY_CARE_PROVIDER_SITE_OTHER): Payer: 59

## 2019-10-26 ENCOUNTER — Telehealth: Payer: Self-pay

## 2019-10-26 DIAGNOSIS — E7800 Pure hypercholesterolemia, unspecified: Secondary | ICD-10-CM | POA: Diagnosis not present

## 2019-10-26 LAB — LIPID PANEL
Cholesterol: 207 mg/dL — ABNORMAL HIGH (ref 0–200)
HDL: 45.7 mg/dL (ref >39.00)
LDL Cholesterol: 123 mg/dL — ABNORMAL HIGH (ref 0–99)
NonHDL: 161.64
Total CHOL/HDL Ratio: 5
Triglycerides: 194 mg/dL — ABNORMAL HIGH (ref 0.0–149.0)
VLDL: 38.8 mg/dL (ref 0.0–40.0)

## 2019-10-26 MED ORDER — PRAVASTATIN SODIUM 20 MG PO TABS: 20.0000 mg | ORAL_TABLET | Freq: Every day | ORAL | 3 refills | Status: DC

## 2019-10-26 NOTE — Telephone Encounter (Signed)
[facility] Night - Client Nonclinical Telephone Record  [facility] Client [facility] Night - Client Client Site [place] Physician Webb Silversmith - NP Contact Type Call Who Is Calling Patient / Member / Family / Caregiver Caller Name Leniya Fregoso Caller Phone Number [phone] Call Type Message Only Information Provided Reason for Call Returning a Call from the Office Initial [place] states she is returning a phone call. Additional Comment Disp. Time Disposition Final User 10/23/2019 5:07:03 PM General Information Provided Yes Ronnald Ramp, [place] Call Closed By: Eliane Decree Transaction Date/Time: 10/23/2019 5:05:02 PM (ET)

## 2019-10-26 NOTE — Telephone Encounter (Signed)
It appears that lab was trying to contact pt to screen her for covid for her apt today. No other note of anyone trying to contact pt.

## 2020-01-10 ENCOUNTER — Other Ambulatory Visit: Payer: Self-pay | Admitting: Internal Medicine

## 2020-01-19 ENCOUNTER — Encounter: Payer: Self-pay | Admitting: Internal Medicine

## 2020-01-19 MED ORDER — BUSPIRONE HCL 15 MG PO TABS: 15.0000 mg | ORAL_TABLET | Freq: Three times a day (TID) | ORAL | 0 refills | Status: DC

## 2020-03-03 IMAGING — MG MM DIGITAL DIAGNOSTIC UNILAT*R* W/ TOMO W/ CAD
4 series · 4 of 12 positions shown · non-contrast
Comparison: 05/29/2019, 03/19/2012

CLINICAL DATA: Patient returns after screening study for evaluation
of possible RIGHT breast asymmetry.

EXAM:
DIGITAL DIAGNOSTIC UNILATERAL RIGHT MAMMOGRAM WITH CAD AND TOMO

[R CC synth-2D]
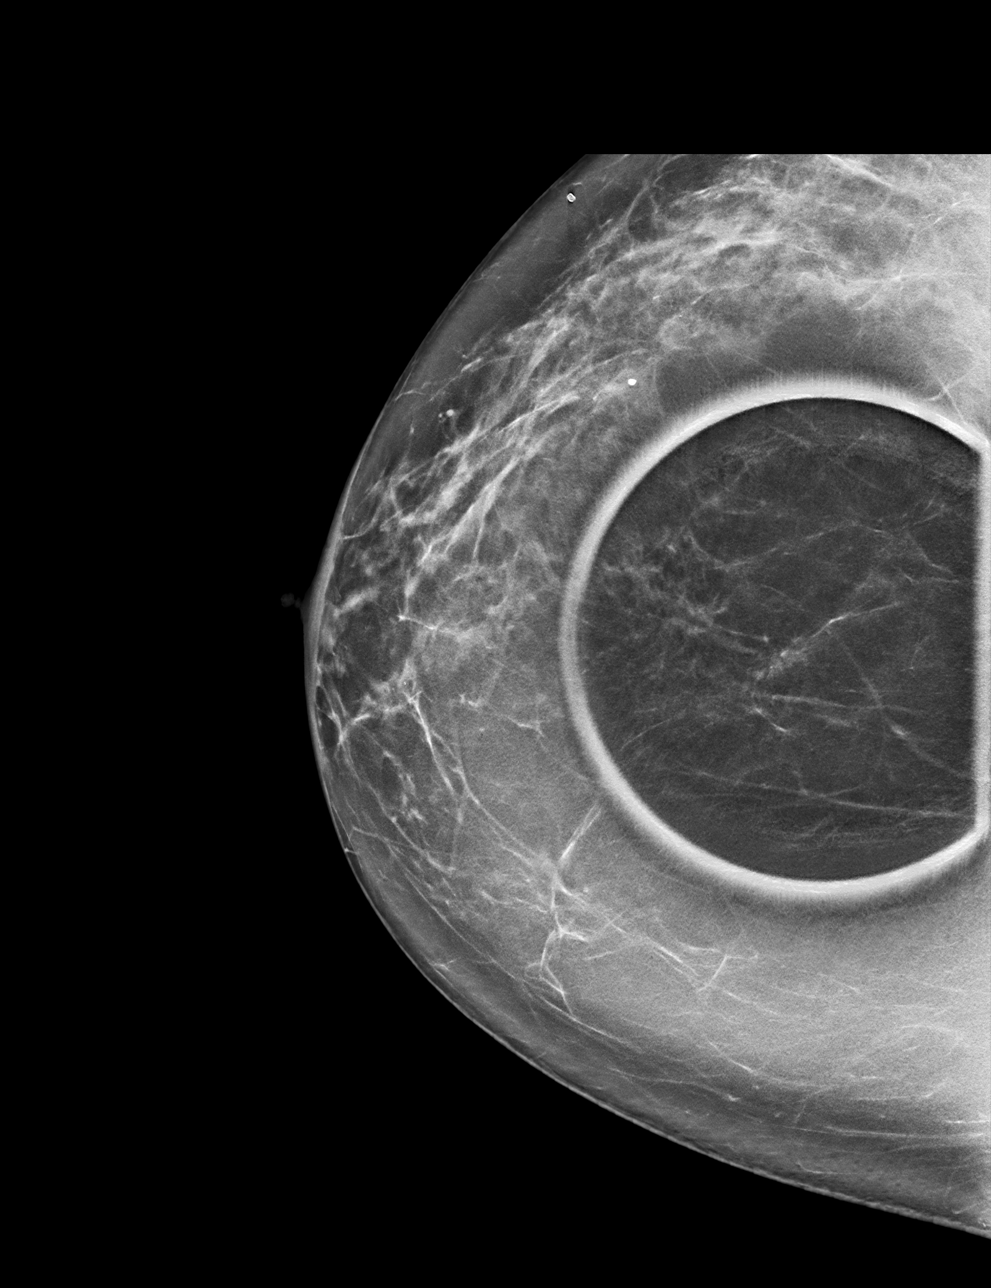

[R ML synth-2D]
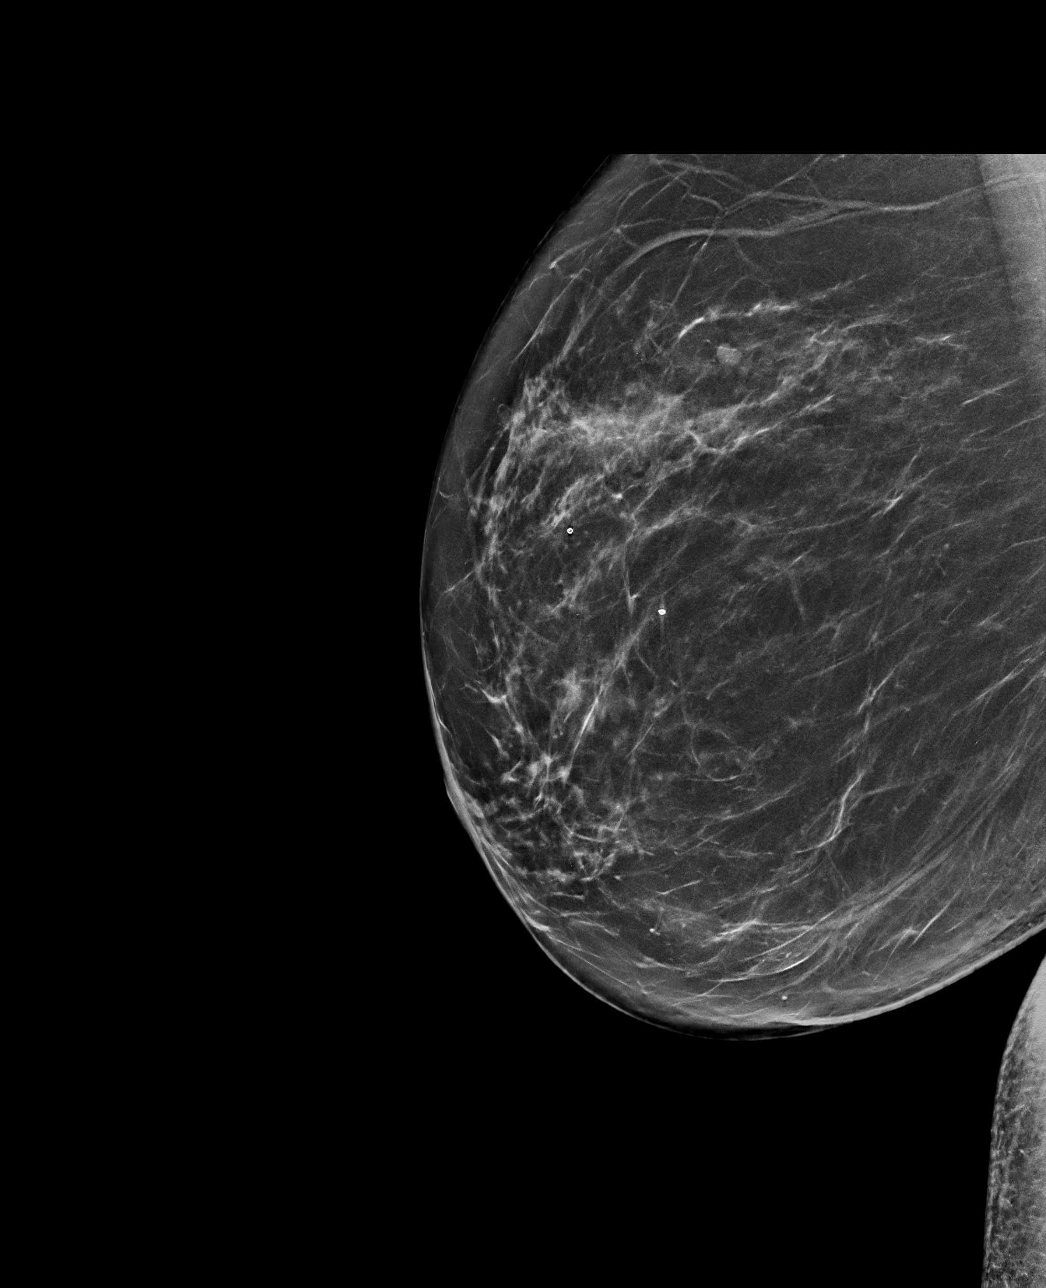

[R CC tomo · tomo slice 35/69.0]
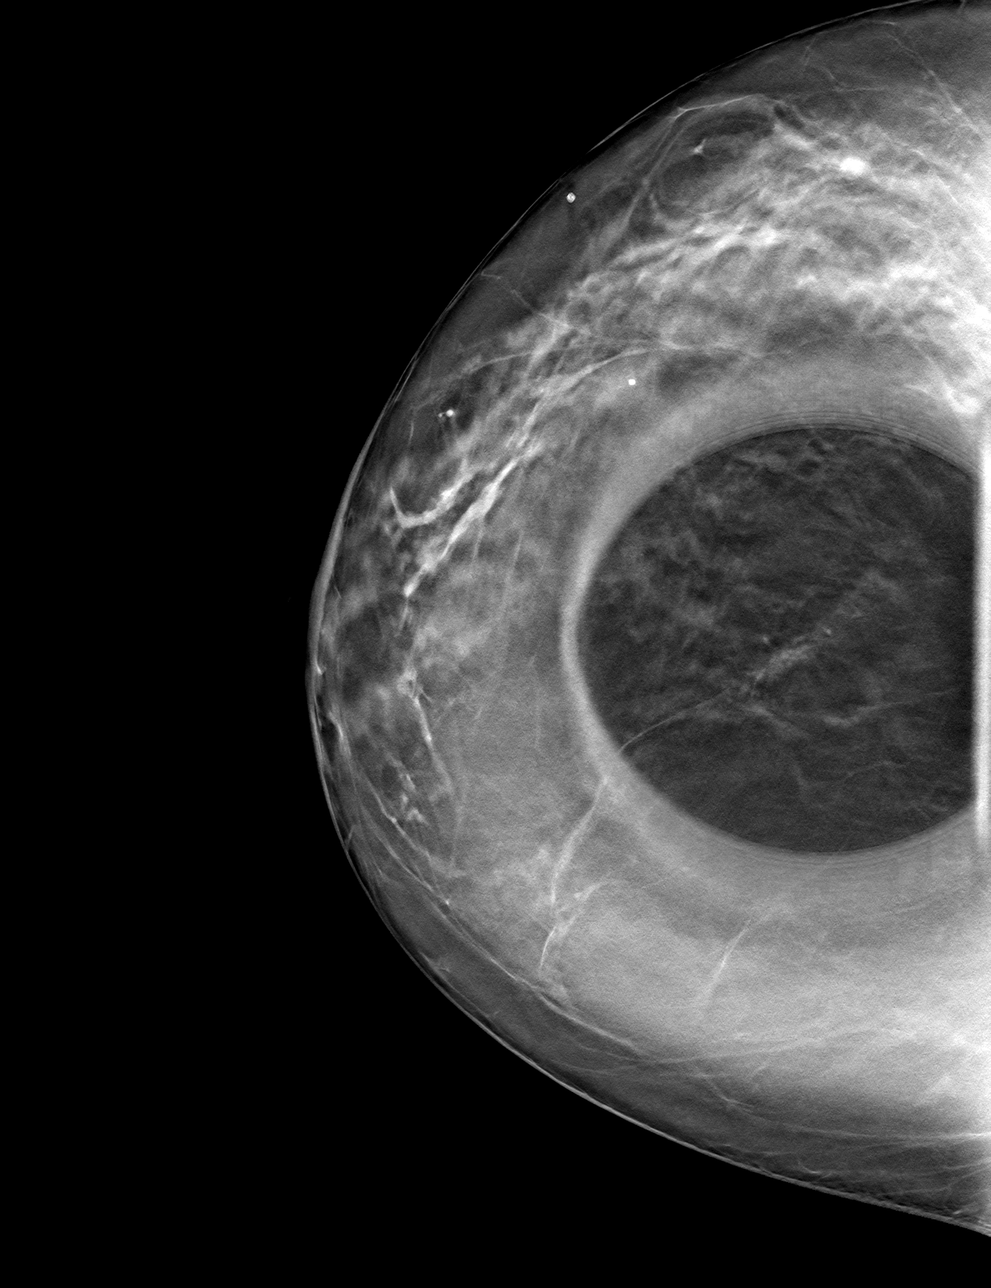

[R ML tomo · tomo slice 42/83.0]
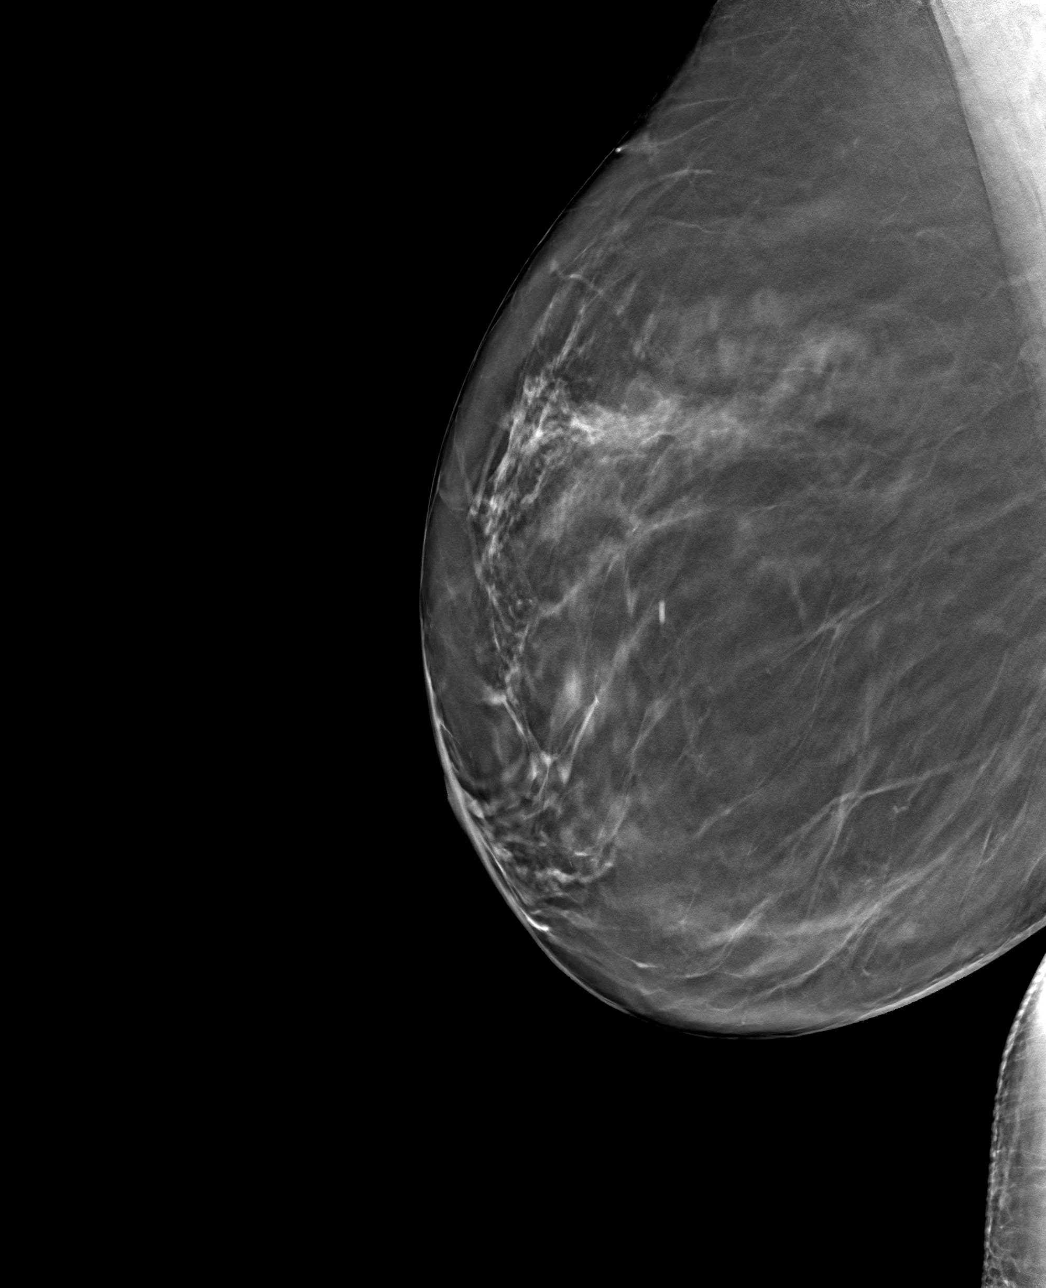

[4 of 12 positions shown; findings below may reference images not displayed]

ACR Breast Density Category b: There are scattered areas of
fibroglandular density.
FINDINGS: Additional 2-D and 3-D images are performed. These view showed no
persistent mass or asymmetry in the RIGHT breast.

Mammographic images were processed with CAD.
IMPRESSION: No mammographic evidence for malignancy.

RECOMMENDATION:
Screening mammogram in one year.(Code:Q0-R-22X)

I have discussed the findings and recommendations with the patient.
If applicable, a reminder letter will be sent to the patient
regarding the next appointment.

BI-RADS CATEGORY  1: Negative.

## 2020-03-31 ENCOUNTER — Other Ambulatory Visit: Payer: Self-pay | Admitting: Internal Medicine

## 2020-04-15 ENCOUNTER — Encounter: Payer: Self-pay | Admitting: Internal Medicine

## 2020-04-22 ENCOUNTER — Ambulatory Visit (INDEPENDENT_AMBULATORY_CARE_PROVIDER_SITE_OTHER): Payer: 59 | Admitting: Internal Medicine

## 2020-04-22 ENCOUNTER — Encounter: Payer: Self-pay | Admitting: Internal Medicine

## 2020-04-22 ENCOUNTER — Other Ambulatory Visit: Payer: Self-pay

## 2020-04-22 VITALS — BP 130/80 | HR 65 | Temp 97.8°F | Ht 61.5 in | Wt 173.5 lb

## 2020-04-22 DIAGNOSIS — F32A Depression, unspecified: Secondary | ICD-10-CM | POA: Diagnosis not present

## 2020-04-22 DIAGNOSIS — Z23 Encounter for immunization: Secondary | ICD-10-CM | POA: Diagnosis not present

## 2020-04-22 DIAGNOSIS — E7800 Pure hypercholesterolemia, unspecified: Secondary | ICD-10-CM

## 2020-04-22 DIAGNOSIS — F419 Anxiety disorder, unspecified: Secondary | ICD-10-CM

## 2020-04-22 MED ORDER — ALPRAZOLAM 0.5 MG PO TABS: 0.5000 mg | ORAL_TABLET | Freq: Every day | ORAL | 0 refills | Status: DC | PRN

## 2020-04-22 NOTE — Patient Instructions (Signed)

## 2020-04-22 NOTE — Assessment & Plan Note (Signed)
Persistent, better now Will refill Xanax as the RX she has now is expired Continue Wellbutrin and Buspar Support offered She is not interested in therapy at this time Will check CBC, CMET, and Vit D today

## 2020-04-22 NOTE — Progress Notes (Signed)
Subjective:    Patient ID: Regina Salazar, female    DOB: 20-May-1971, 49 y.o.   MRN: [id number]  HPI  Pt presents to the clinic today for follow up of anxiety and depression. She reports she has overall been doing well but did have a panic attack about 2-3 weeks ago. She reports symptoms of  excessive worry, chest tightness triggered by refinancing her house. She reports she does not trust anyone, and she was concerned about the person that was refinancing her home, stealing her information. Her last panic attack was about 1 year. She reports her anxiety depends on life circumstances, depression seems well controlled. She is not currently seeing a therapist. She is currently managed on Wellbutrin, Buspar and Xanax. She reports good support from her husband and sister.  She would like her physical labs done today, her physical is scheduled for December.  Review of Systems      Past Medical History:  Diagnosis Date  . Depression     Current Outpatient Medications  Medication Sig Dispense Refill  . ALPRAZolam (XANAX) 0.5 MG tablet Take 1 tablet (0.5 mg total) by mouth daily as needed for anxiety. 1/2 to 1 tablet up to three times as needed for panic attacks and insomnia 30 tablet 0  . buPROPion (WELLBUTRIN XL) 150 MG 24 hr tablet Take 1 tablet (150 mg total) by mouth daily. MUST SCHEDULE PHYSICAL FOR REFILLS 90 tablet 0  . busPIRone (BUSPAR) 15 MG tablet Take 1 tablet (15 mg total) by mouth 3 (three) times daily. 270 tablet 0  . pravastatin (PRAVACHOL) 20 MG tablet Take 1 tablet (20 mg total) by mouth daily. 90 tablet 3   No current facility-administered medications for this visit.    Allergies  Allergen Reactions  . Prednisone Anxiety    Induces her anxiety    Family History  Problem Relation Age of Onset  . Cancer Mother 66       breast  . Cancer Father        colon  . Diabetes Father     Social History   Socioeconomic History  . Marital status: Married    Spouse  name: Not on file  . Number of children: Not on file  . Years of education: Not on file  . Highest education level: Not on file  Occupational History  . Not on file  Tobacco Use  . Smoking status: Never Smoker  . Smokeless tobacco: Never Used  Vaping Use  . Vaping Use: Never used  Substance and Sexual Activity  . Alcohol use: No  . Drug use: No  . Sexual activity: Yes    Partners: Male    Birth control/protection: None  Other Topics Concern  . Not on file  Social History Narrative   Moved here from [place] last year for her husband's job.   3 adopted children ages 20, 65, 76.      Works at a daycare.   Social Determinants of Health   Financial Resource Strain:   . Difficulty of Paying Living Expenses: Not on file  Food Insecurity:   . Worried About Charity fundraiser in the Last Year: Not on file  . Ran Out of Food in the Last Year: Not on file  Transportation Needs:   . Lack of Transportation (Medical): Not on file  . Lack of Transportation (Non-Medical): Not on file  Physical Activity:   . Days of Exercise per Week: Not on file  . Minutes  of Exercise per Session: Not on file  Stress:   . Feeling of Stress : Not on file  Social Connections:   . Frequency of Communication with Friends and Family: Not on file  . Frequency of Social Gatherings with Friends and Family: Not on file  . Attends Religious Services: Not on file  . Active Member of Clubs or Organizations: Not on file  . Attends Archivist Meetings: Not on file  . Marital Status: Not on file  Intimate Partner Violence:   . Fear of Current or Ex-Partner: Not on file  . Emotionally Abused: Not on file  . Physically Abused: Not on file  . Sexually Abused: Not on file     Constitutional: Denies fever, malaise, fatigue, headache or abrupt weight changes.  Respiratory: Denies difficulty breathing, shortness of breath, cough or sputum production.   Cardiovascular: Denies chest pain, chest  tightness, palpitations or swelling in the hands or feet.  Gastrointestinal: Denies abdominal pain, bloating, constipation, diarrhea or blood in the stool.  Neurological: Denies dizziness, difficulty with memory, difficulty with speech or problems with balance and coordination.  Psych: Pt has a history of anxiety and depression. Denies SI/HI.  No other specific complaints in a complete review of systems (except as listed in HPI above).  Objective:   Physical Exam  BP 130/80   Pulse 65   Temp 97.8 F (36.6 C) (Temporal)   Ht 5' 1.5" (1.562 m)   Wt 173 lb 8 oz (78.7 kg)   SpO2 96%   BMI 32.25 kg/m  Wt Readings from Last 3 Encounters:  04/22/20 173 lb 8 oz (78.7 kg)  05/08/19 170 lb (77.1 kg)  04/21/19 176 lb (79.8 kg)    General: Appears her stated age, obese, in NAD. Cardiovascular: Normal rate and rhythm. S1,S2 noted.  No murmur, rubs or gallops noted.  Pulmonary/Chest: Normal effort and positive vesicular breath sounds. No respiratory distress. No wheezes, rales or ronchi noted.  Neurological: Alert and oriented.  Psychiatric: Mood and affect normal. Behavior is normal. Judgment and thought content normal.    BMET    Component Value Date/Time   NA 137 07/24/2019 0819   K 4.1 07/24/2019 0819   CL 104 07/24/2019 0819   CO2 24 07/24/2019 0819   GLUCOSE 96 07/24/2019 0819   BUN 18 07/24/2019 0819   CREATININE 0.90 07/24/2019 0819   CALCIUM 9.0 07/24/2019 0819    Lipid Panel     Component Value Date/Time   CHOL 207 (H) 10/26/2019 0740   TRIG 194.0 (H) 10/26/2019 0740   HDL 45.70 10/26/2019 0740   CHOLHDL 5 10/26/2019 0740   VLDL 38.8 10/26/2019 0740   LDLCALC 123 (H) 10/26/2019 0740    CBC    Component Value Date/Time   WBC 7.0 04/07/2019 1005   RBC 4.82 04/07/2019 1005   HGB 14.7 04/07/2019 1005   HCT 43.6 04/07/2019 1005   PLT 336.0 04/07/2019 1005   MCV 90.4 04/07/2019 1005   MCH 30.8 06/05/2012 0931   MCHC 33.7 04/07/2019 1005   RDW 12.7 04/07/2019  1005    Hgb A1C Lab Results  Component Value Date   HGBA1C 6.2 04/07/2019            Assessment & Plan:

## 2020-04-22 NOTE — Assessment & Plan Note (Signed)
CMET, Lipid and A1C today Reinforced low fat diet Continue Pravastatin for now

## 2020-04-23 LAB — COMPREHENSIVE METABOLIC PANEL
AG Ratio: 1.5 (calc) (ref 1.0–2.5)
ALT: 34 U/L — ABNORMAL HIGH (ref 6–29)
AST: 28 U/L (ref 10–35)
Albumin: 4.8 g/dL (ref 3.6–5.1)
Alkaline phosphatase (APISO): 73 U/L (ref 37–153)
BUN: 21 mg/dL (ref 7–25)
CO2: 22 mmol/L (ref 20–32)
Calcium: 9.8 mg/dL (ref 8.6–10.4)
Chloride: 103 mmol/L (ref 98–110)
Creat: 0.89 mg/dL (ref 0.50–1.05)
Globulin: 3.1 g/dL (calc) (ref 1.9–3.7)
Glucose, Bld: 85 mg/dL (ref 65–99)
Potassium: 4 mmol/L (ref 3.5–5.3)
Sodium: 139 mmol/L (ref 135–146)
Total Bilirubin: 0.8 mg/dL (ref 0.2–1.2)
Total Protein: 7.9 g/dL (ref 6.1–8.1)

## 2020-04-23 LAB — LIPID PANEL
Cholesterol: 205 mg/dL — ABNORMAL HIGH (ref <200)
HDL: 55 mg/dL (ref >=50)
LDL Cholesterol (Calc): 129 mg/dL (calc) — ABNORMAL HIGH
Non-HDL Cholesterol (Calc): 150 mg/dL (calc) — ABNORMAL HIGH (ref <130)
Total CHOL/HDL Ratio: 3.7 (calc) (ref <5.0)
Triglycerides: 106 mg/dL (ref <150)

## 2020-04-23 LAB — CBC
HCT: 43.1 % (ref 35.0–45.0)
Hemoglobin: 14.5 g/dL (ref 11.7–15.5)
MCH: 30 pg (ref 27.0–33.0)
MCHC: 33.6 g/dL (ref 32.0–36.0)
MCV: 89.2 fL (ref 80.0–100.0)
MPV: 11.1 fL (ref 7.5–12.5)
Platelets: 281 10*3/uL (ref 140–400)
RBC: 4.83 10*6/uL (ref 3.80–5.10)
RDW: 12 % (ref 11.0–15.0)
WBC: 8.5 10*3/uL (ref 3.8–10.8)

## 2020-04-23 LAB — HEMOGLOBIN A1C
Hgb A1c MFr Bld: 6.1 % of total Hgb — ABNORMAL HIGH (ref <5.7)
Mean Plasma Glucose: 128 (calc)
eAG (mmol/L): 7.1 (calc)

## 2020-04-23 LAB — VITAMIN D 25 HYDROXY (VIT D DEFICIENCY, FRACTURES): Vit D, 25-Hydroxy: 35 ng/mL (ref 30–100)

## 2020-04-25 ENCOUNTER — Other Ambulatory Visit: Payer: Self-pay | Admitting: Internal Medicine

## 2020-04-25 DIAGNOSIS — Z1231 Encounter for screening mammogram for malignant neoplasm of breast: Secondary | ICD-10-CM

## 2020-06-01 ENCOUNTER — Encounter: Payer: Self-pay | Admitting: Internal Medicine

## 2020-06-01 ENCOUNTER — Other Ambulatory Visit: Payer: Self-pay | Admitting: Internal Medicine

## 2020-06-03 ENCOUNTER — Ambulatory Visit: Payer: 59

## 2020-06-03 ENCOUNTER — Ambulatory Visit
Admission: RE | Admit: 2020-06-03 | Discharge: 2020-06-03 | Disposition: A | Discharge status: Home / Self Care | Payer: 59 | Source: Ambulatory Visit | Attending: Internal Medicine | Admitting: Internal Medicine

## 2020-06-03 ENCOUNTER — Other Ambulatory Visit: Payer: Self-pay

## 2020-06-03 DIAGNOSIS — Z1231 Encounter for screening mammogram for malignant neoplasm of breast: Secondary | ICD-10-CM

## 2020-06-16 ENCOUNTER — Encounter: Payer: 59 | Admitting: Internal Medicine

## 2020-07-21 ENCOUNTER — Encounter: Payer: Self-pay | Admitting: Internal Medicine

## 2020-07-21 ENCOUNTER — Other Ambulatory Visit: Payer: Self-pay

## 2020-07-21 ENCOUNTER — Ambulatory Visit (INDEPENDENT_AMBULATORY_CARE_PROVIDER_SITE_OTHER): Payer: 59 | Admitting: Internal Medicine

## 2020-07-21 VITALS — BP 124/74 | HR 71 | Temp 96.9°F | Ht 61.5 in | Wt 175.0 lb

## 2020-07-21 DIAGNOSIS — Z79899 Other long term (current) drug therapy: Secondary | ICD-10-CM

## 2020-07-21 DIAGNOSIS — R7303 Prediabetes: Secondary | ICD-10-CM

## 2020-07-21 DIAGNOSIS — B078 Other viral warts: Secondary | ICD-10-CM | POA: Diagnosis not present

## 2020-07-21 DIAGNOSIS — Z0001 Encounter for general adult medical examination with abnormal findings: Secondary | ICD-10-CM | POA: Diagnosis not present

## 2020-07-21 NOTE — Patient Instructions (Signed)
Health Maintenance, Female Adopting a healthy lifestyle and getting preventive care are important in promoting health and wellness. Ask your health care provider about:  The right schedule for you to have regular tests and exams.  Things you can do on your own to prevent diseases and keep yourself healthy. What should I know about diet, weight, and exercise? Eat a healthy diet  Eat a diet that includes plenty of vegetables, fruits, low-fat dairy products, and lean protein.  Do not eat a lot of foods that are high in solid fats, added sugars, or sodium.   Maintain a healthy weight Body mass index (BMI) is used to identify weight problems. It estimates body fat based on height and weight. Your health care provider can help determine your BMI and help you achieve or maintain a healthy weight. Get regular exercise Get regular exercise. This is one of the most important things you can do for your health. Most adults should:  Exercise for at least 150 minutes each week. The exercise should increase your heart rate and make you sweat (moderate-intensity exercise).  Do strengthening exercises at least twice a week. This is in addition to the moderate-intensity exercise.  Spend less time sitting. Even light physical activity can be beneficial. Watch cholesterol and blood lipids Have your blood tested for lipids and cholesterol at 51 years of age, then have this test every 5 years. Have your cholesterol levels checked more often if:  Your lipid or cholesterol levels are high.  You are older than 51 years of age.  You are at high risk for heart disease. What should I know about cancer screening? Depending on your health history and family history, you may need to have cancer screening at various ages. This may include screening for:  Breast cancer.  Cervical cancer.  Colorectal cancer.  Skin cancer.  Lung cancer. What should I know about heart disease, diabetes, and high blood  pressure? Blood pressure and heart disease  High blood pressure causes heart disease and increases the risk of stroke. This is more likely to develop in people who have high blood pressure readings, are of African descent, or are overweight.  Have your blood pressure checked: ? Every 3-5 years if you are 18-39 years of age. ? Every year if you are 40 years old or older. Diabetes Have regular diabetes screenings. This checks your fasting blood sugar level. Have the screening done:  Once every three years after age 40 if you are at a normal weight and have a low risk for diabetes.  More often and at a younger age if you are overweight or have a high risk for diabetes. What should I know about preventing infection? Hepatitis B If you have a higher risk for hepatitis B, you should be screened for this virus. Talk with your health care provider to find out if you are at risk for hepatitis B infection. Hepatitis C Testing is recommended for:  Everyone born from 1945 through 1965.  Anyone with known risk factors for hepatitis C. Sexually transmitted infections (STIs)  Get screened for STIs, including gonorrhea and chlamydia, if: ? You are sexually active and are younger than 51 years of age. ? You are older than 51 years of age and your health care provider tells you that you are at risk for this type of infection. ? Your sexual activity has changed since you were last screened, and you are at increased risk for chlamydia or gonorrhea. Ask your health care provider   if you are at risk.  Ask your health care provider about whether you are at high risk for HIV. Your health care provider may recommend a prescription medicine to help prevent HIV infection. If you choose to take medicine to prevent HIV, you should first get tested for HIV. You should then be tested every 3 months for as long as you are taking the medicine. Pregnancy  If you are about to stop having your period (premenopausal) and  you may become pregnant, seek counseling before you get pregnant.  Take 400 to 800 micrograms (mcg) of folic acid every day if you become pregnant.  Ask for birth control (contraception) if you want to prevent pregnancy. Osteoporosis and menopause Osteoporosis is a disease in which the bones lose minerals and strength with aging. This can result in bone fractures. If you are 65 years old or older, or if you are at risk for osteoporosis and fractures, ask your health care provider if you should:  Be screened for bone loss.  Take a calcium or vitamin D supplement to lower your risk of fractures.  Be given hormone replacement therapy (HRT) to treat symptoms of menopause. Follow these instructions at home: Lifestyle  Do not use any products that contain nicotine or tobacco, such as cigarettes, e-cigarettes, and chewing tobacco. If you need help quitting, ask your health care provider.  Do not use street drugs.  Do not share needles.  Ask your health care provider for help if you need support or information about quitting drugs. Alcohol use  Do not drink alcohol if: ? Your health care provider tells you not to drink. ? You are pregnant, may be pregnant, or are planning to become pregnant.  If you drink alcohol: ? Limit how much you use to 0-1 drink a day. ? Limit intake if you are breastfeeding.  Be aware of how much alcohol is in your drink. In the [place] one drink equals one 12 oz bottle of beer (355 mL), one 5 oz glass of wine (148 mL), or one 1 oz glass of hard liquor (44 mL). General instructions  Schedule regular health, dental, and eye exams.  Stay current with your vaccines.  Tell your health care provider if: ? You often feel depressed. ? You have ever been abused or do not feel safe at home. Summary  Adopting a healthy lifestyle and getting preventive care are important in promoting health and wellness.  Follow your health care provider's instructions about healthy  diet, exercising, and getting tested or screened for diseases.  Follow your health care provider's instructions on monitoring your cholesterol and blood pressure. This information is not intended to replace advice given to you by your health care provider. Make sure you discuss any questions you have with your health care provider. Document Revised: 05/28/2018 Document Reviewed: 05/28/2018 Elsevier Patient Education  2021 Elsevier Inc.  

## 2020-07-21 NOTE — Progress Notes (Signed)
Subjective:    Patient ID: Regina Salazar, female    DOB: [birth year], 50 y.o.   MRN: [id number]  HPI  Patient presents the clinic today for her annual exam.  Flu: 04/2020 Tetanus: 02/2012 Covid: Moderna x2 Pap smear: 05/2012 Mammogram: 05/2020 Colon screening: 05/2019 Vision screen: annually Dentist: biannually  Diet: She does eat lean meats. She consumes fruits and veggies daily. She tries to avoid fried foods. She drinks mostly water, some soda. Exercise: Stationary Bike and Treadmill, 5-6 days per week.  Review of Systems      Past Medical History:  Diagnosis Date  . Depression     Current Outpatient Medications  Medication Sig Dispense Refill  . ALPRAZolam (XANAX) 0.5 MG tablet Take 1 tablet (0.5 mg total) by mouth daily as needed for anxiety. 1/2 to 1 tablet up to three times as needed for panic attacks and insomnia 30 tablet 0  . buPROPion (WELLBUTRIN XL) 150 MG 24 hr tablet Take 1 tablet (150 mg total) by mouth daily. MUST SCHEDULE PHYSICAL FOR REFILLS 90 tablet 0  . busPIRone (BUSPAR) 15 MG tablet TAKE 1 TABLET BY MOUTH 3  TIMES DAILY 270 tablet 0  . pravastatin (PRAVACHOL) 20 MG tablet Take 1 tablet (20 mg total) by mouth daily. 90 tablet 3   No current facility-administered medications for this visit.    Allergies  Allergen Reactions  . Prednisone Anxiety    Induces her anxiety    Family History  Problem Relation Age of Onset  . Cancer Mother 45       breast  . Cancer Father        colon  . Diabetes Father     Social History   Socioeconomic History  . Marital status: Married    Spouse name: Not on file  . Number of children: Not on file  . Years of education: Not on file  . Highest education level: Not on file  Occupational History  . Not on file  Tobacco Use  . Smoking status: Never Smoker  . Smokeless tobacco: Never Used  Vaping Use  . Vaping Use: Never used  Substance and Sexual Activity  . Alcohol use: No  . Drug use: No  .  Sexual activity: Yes    Partners: Male    Birth control/protection: None  Other Topics Concern  . Not on file  Social History Narrative   Moved here from [place] last year for her husband's job.   3 adopted children ages 52, 37, 70.      Works at a daycare.   Social Determinants of Health   Financial Resource Strain: Not on file  Food Insecurity: Not on file  Transportation Needs: Not on file  Physical Activity: Not on file  Stress: Not on file  Social Connections: Not on file  Intimate Partner Violence: Not on file     Constitutional: Denies fever, malaise, fatigue, headache or abrupt weight changes.  HEENT: Denies eye pain, eye redness, ear pain, ringing in the ears, wax buildup, runny nose, nasal congestion, bloody nose, or sore throat. Respiratory: Denies difficulty breathing, shortness of breath, cough or sputum production.   Cardiovascular: Denies chest pain, chest tightness, palpitations or swelling in the hands or feet.  Gastrointestinal: Denies abdominal pain, bloating, constipation, diarrhea or blood in the stool.  GU: Denies urgency, frequency, pain with urination, burning sensation, blood in urine, odor or discharge. Musculoskeletal: Pt reports chronic left elbow pain, intermittent low back pain. Denies decrease in range  of motion, difficulty with gait, muscle pain or joint pain and swelling.  Skin: Pt reports wart on left index finger, left hand. Denies redness, rashes, lesions or ulcercations.  Neurological: Denies dizziness, difficulty with memory, difficulty with speech or problems with balance and coordination.  Psych: Patient has a history of anxiety and depression.  Denies SI/HI.  No other specific complaints in a complete review of systems (except as listed in HPI above).  Objective:   Physical Exam  BP 124/74   Pulse 71   Temp (!) 96.9 F (36.1 C) (Temporal)   Ht 5' 1.5" (1.562 m)   Wt 175 lb (79.4 kg)   SpO2 98%   BMI 32.53 kg/m   Wt Readings  from Last 3 Encounters:  04/22/20 173 lb 8 oz (78.7 kg)  05/08/19 170 lb (77.1 kg)  04/21/19 176 lb (79.8 kg)    General: Appears her stated age, well developed, well nourished in NAD. Skin: Warm, dry and intact. 2 mm wart noted to lateral left index finger. 3 mm wart noted at base of left thumb. HEENT: Head: normal shape and size; Eyes: sclera white, no icterus, conjunctiva pink, PERRLA and EOMs intact;  Neck:  Neck supple, trachea midline. No masses, lumps or thyromegaly present.  Cardiovascular: Normal rate and rhythm. S1,S2 noted.  No murmur, rubs or gallops noted. No JVD or BLE edema. No carotid bruits noted. Pulmonary/Chest: Normal effort and positive vesicular breath sounds. No respiratory distress. No wheezes, rales or ronchi noted.  Abdomen: Soft and nontender. Normal bowel sounds. No distention or masses noted. Liver, spleen and kidneys non palpable. Musculoskeletal: Strength 5/5 BUE/BLE. No difficulty with gait.  Neurological: Alert and oriented. Cranial nerves II-XII grossly intact. Coordination normal.  Psychiatric: Mood and affect normal. Behavior is normal. Judgment and thought content normal.     BMET    Component Value Date/Time   NA 139 04/22/2020 1432   K 4.0 04/22/2020 1432   CL 103 04/22/2020 1432   CO2 22 04/22/2020 1432   GLUCOSE 85 04/22/2020 1432   BUN 21 04/22/2020 1432   CREATININE 0.89 04/22/2020 1432   CALCIUM 9.8 04/22/2020 1432    Lipid Panel     Component Value Date/Time   CHOL 205 (H) 04/22/2020 1432   TRIG 106 04/22/2020 1432   HDL 55 04/22/2020 1432   CHOLHDL 3.7 04/22/2020 1432   VLDL 38.8 10/26/2019 0740   LDLCALC 129 (H) 04/22/2020 1432    CBC    Component Value Date/Time   WBC 8.5 04/22/2020 1432   RBC 4.83 04/22/2020 1432   HGB 14.5 04/22/2020 1432   HCT 43.1 04/22/2020 1432   PLT 281 04/22/2020 1432   MCV 89.2 04/22/2020 1432   MCH 30.0 04/22/2020 1432   MCHC 33.6 04/22/2020 1432   RDW 12.0 04/22/2020 1432    Hgb  A1C Lab Results  Component Value Date   HGBA1C 6.1 (H) 04/22/2020           Assessment & Plan:   Preventative Health Maintenance:  Flu shot UTD Tetanus UTD Encouraged her to get her COVID booster She has been unable to do pap smear secondary to pain. Advised her to contact GYN Mammogram UTD Colon screening UTD Encouraged her to consume a balanced diet and exercise regimen Advised her to see an eye doctor and dentist annually Labs from 04/2020 reviewed, she will schedule lab only appt for POCT A1C  Wart of Left Index Finger, Left Hand:  Discussed risk of procedure including  pain, infection and incomplete wart removal Informed consent obtained verbally Cryotherapy used for 6 seconds x 2 cycles Patient tolerated procedure well, no complications Aftercare instructions given  RTC in 1 year, sooner if needed  Nicki Reaper, NP This visit occurred during the SARS-CoV-2 public health emergency.  Safety protocols were in place, including screening questions prior to the visit, additional usage of staff PPE, and extensive cleaning of exam room while observing appropriate contact time as indicated for disinfecting solutions.

## 2020-07-21 NOTE — Assessment & Plan Note (Signed)
Will repeat POCT A1C next week, lab only

## 2020-07-22 ENCOUNTER — Other Ambulatory Visit: Payer: Self-pay | Admitting: Internal Medicine

## 2020-07-22 DIAGNOSIS — R7303 Prediabetes: Secondary | ICD-10-CM

## 2020-07-27 ENCOUNTER — Other Ambulatory Visit: Payer: Self-pay

## 2020-07-27 ENCOUNTER — Other Ambulatory Visit (INDEPENDENT_AMBULATORY_CARE_PROVIDER_SITE_OTHER): Payer: 59

## 2020-07-27 DIAGNOSIS — R7303 Prediabetes: Secondary | ICD-10-CM | POA: Diagnosis not present

## 2020-07-27 LAB — POCT GLYCOSYLATED HEMOGLOBIN (HGB A1C): Hemoglobin A1C: 5.9 % — AB (ref 4.0–5.6)

## 2020-09-01 ENCOUNTER — Encounter: Payer: Self-pay | Admitting: Internal Medicine

## 2020-09-01 MED ORDER — PRAVASTATIN SODIUM 20 MG PO TABS: 20.0000 mg | ORAL_TABLET | Freq: Every day | ORAL | 0 refills | Status: DC

## 2020-09-06 ENCOUNTER — Encounter: Payer: Self-pay | Admitting: Internal Medicine

## 2020-09-07 MED ORDER — BUPROPION HCL ER (XL) 150 MG PO TB24: 150.0000 mg | ORAL_TABLET | Freq: Every day | ORAL | 0 refills | Status: DC

## 2020-09-07 MED ORDER — BUSPIRONE HCL 15 MG PO TABS: 15.0000 mg | ORAL_TABLET | Freq: Three times a day (TID) | ORAL | 0 refills | Status: DC

## 2020-11-09 ENCOUNTER — Ambulatory Visit (INDEPENDENT_AMBULATORY_CARE_PROVIDER_SITE_OTHER): Payer: 59 | Admitting: Adult Health

## 2020-11-09 ENCOUNTER — Other Ambulatory Visit: Payer: Self-pay

## 2020-11-09 ENCOUNTER — Encounter: Payer: Self-pay | Admitting: Adult Health

## 2020-11-09 VITALS — BP 128/86 | HR 76 | Temp 98.6°F | Ht 61.5 in | Wt 174.0 lb

## 2020-11-09 DIAGNOSIS — F32A Depression, unspecified: Secondary | ICD-10-CM | POA: Diagnosis not present

## 2020-11-09 DIAGNOSIS — F419 Anxiety disorder, unspecified: Secondary | ICD-10-CM

## 2020-11-09 DIAGNOSIS — R7303 Prediabetes: Secondary | ICD-10-CM

## 2020-11-09 DIAGNOSIS — E7800 Pure hypercholesterolemia, unspecified: Secondary | ICD-10-CM

## 2020-11-09 NOTE — Progress Notes (Signed)
Established Patient Office Visit  Subjective:  Patient ID: Regina Salazar, female    DOB: 07/19/1970  Age: 50 y.o. MRN: [id number]  CC:  Chief Complaint  Patient presents with  . Transitions Of Care    Patient just wants to establish care with provider    HPI Regina Salazar presents for transfer of care.   She is still using xanax just as needed.  Denies any suicidal ideation.   Also taking Buspar and Wellbutrin for anxiety / depression.   Patient  denies any fever, body aches,chills, rash, chest pain, shortness of breath, nausea, vomiting, or diarrhea.   Denies dizziness, lightheadedness, pre syncopal or syncopal episodes.   Past Medical History:  Diagnosis Date  . Depression     Past Surgical History:  Procedure Laterality Date  . COLONOSCOPY WITH PROPOFOL N/A 05/08/2019   Procedure: COLONOSCOPY WITH PROPOFOL;  Surgeon: Virgel Manifold, MD;  Location: [facility];  Service: Endoscopy;  Laterality: N/A;  . cystectomy on writst  1980  . EXAMINATION UNDER ANESTHESIA  06/05/2012   Procedure: EXAM UNDER ANESTHESIA;  Surgeon: Donnamae Jude, MD;  Location: [facility];  Service: Gynecology;  Laterality: N/A;  pap testing    Family History  Problem Relation Age of Onset  . Cancer Mother 29       breast  . Cancer Father        colon  . Diabetes Father     Social History   Socioeconomic History  . Marital status: Married    Spouse name: Not on file  . Number of children: Not on file  . Years of education: Not on file  . Highest education level: Not on file  Occupational History  . Not on file  Tobacco Use  . Smoking status: Never Smoker  . Smokeless tobacco: Never Used  Vaping Use  . Vaping Use: Never used  Substance and Sexual Activity  . Alcohol use: No  . Drug use: No  . Sexual activity: Yes    Partners: Male    Birth control/protection: None  Other Topics Concern  . Not on file  Social History Narrative   Moved here from [place] last  year for her husband's job.   3 adopted children ages 44, 65, 54.      Works at a daycare.   Social Determinants of Health   Financial Resource Strain: Not on file  Food Insecurity: Not on file  Transportation Needs: Not on file  Physical Activity: Not on file  Stress: Not on file  Social Connections: Not on file  Intimate Partner Violence: Not on file    Outpatient Medications Prior to Visit  Medication Sig Dispense Refill  . ALPRAZolam (XANAX) 0.5 MG tablet Take 1 tablet (0.5 mg total) by mouth daily as needed for anxiety. 1/2 to 1 tablet up to three times as needed for panic attacks and insomnia 30 tablet 0  . buPROPion (WELLBUTRIN XL) 150 MG 24 hr tablet Take 1 tablet (150 mg total) by mouth daily. 90 tablet 0  . busPIRone (BUSPAR) 15 MG tablet Take 1 tablet (15 mg total) by mouth 3 (three) times daily. 270 tablet 0  . naproxen sodium (ALEVE) 220 MG tablet Take 220 mg by mouth.    . pravastatin (PRAVACHOL) 20 MG tablet Take 1 tablet (20 mg total) by mouth daily. 90 tablet 0   No facility-administered medications prior to visit.    Allergies  Allergen Reactions  . Prednisone Anxiety  Induces her anxiety    ROS Review of Systems  Constitutional: Negative.   HENT: Negative.   Respiratory: Negative.   Cardiovascular: Negative.   Gastrointestinal: Negative.   Genitourinary: Negative.   Musculoskeletal: Negative.   Skin: Negative.   Neurological: Negative.   Psychiatric/Behavioral: Negative.       Objective:    Physical Exam Vitals reviewed.  Constitutional:      General: She is not in acute distress.    Appearance: Normal appearance. She is well-developed. She is obese. She is not ill-appearing, toxic-appearing or diaphoretic.     Interventions: She is not intubated. HENT:     Head: Normocephalic and atraumatic.     Right Ear: External ear normal.     Left Ear: External ear normal.     Nose: Nose normal.     Mouth/Throat:     Pharynx: No oropharyngeal  exudate.  Eyes:     General: Lids are normal. No scleral icterus.       Right eye: No discharge.        Left eye: No discharge.     Conjunctiva/sclera: Conjunctivae normal.     Right eye: Right conjunctiva is not injected. No exudate or hemorrhage.    Left eye: Left conjunctiva is not injected. No exudate or hemorrhage.    Pupils: Pupils are equal, round, and reactive to light.  Neck:     Thyroid: No thyroid mass or thyromegaly.     Vascular: Normal carotid pulses. No carotid bruit, hepatojugular reflux or JVD.     Trachea: Trachea and phonation normal. No tracheal tenderness or tracheal deviation.     Meningeal: Brudzinski's sign and Kernig's sign absent.  Cardiovascular:     Rate and Rhythm: Normal rate and regular rhythm.     Pulses: Normal pulses.          Radial pulses are 2+ on the right side and 2+ on the left side.       Dorsalis pedis pulses are 2+ on the right side and 2+ on the left side.       Posterior tibial pulses are 2+ on the right side and 2+ on the left side.     Heart sounds: Normal heart sounds, S1 normal and S2 normal. Heart sounds not distant. No murmur heard. No friction rub. No gallop.   Pulmonary:     Effort: Pulmonary effort is normal. No tachypnea, bradypnea, accessory muscle usage or respiratory distress. She is not intubated.     Breath sounds: Normal breath sounds. No stridor. No wheezing, rhonchi or rales.  Chest:     Chest wall: No tenderness.  Breasts:     Right: No supraclavicular adenopathy.     Left: No supraclavicular adenopathy.    Abdominal:     General: Bowel sounds are normal. There is no distension or abdominal bruit.     Palpations: Abdomen is soft. There is no shifting dullness, fluid wave, hepatomegaly, splenomegaly, mass or pulsatile mass.     Tenderness: There is no abdominal tenderness. There is no guarding.     Hernia: No hernia is present.  Musculoskeletal:        General: No tenderness or deformity. Normal range of motion.      Cervical back: Full passive range of motion without pain, normal range of motion and neck supple. No edema, erythema, rigidity or tenderness. No spinous process tenderness or muscular tenderness. Normal range of motion.  Lymphadenopathy:     Head:  Right side of head: No submental, submandibular, tonsillar, preauricular, posterior auricular or occipital adenopathy.     Left side of head: No submental, submandibular, tonsillar, preauricular, posterior auricular or occipital adenopathy.     Cervical: No cervical adenopathy.     Right cervical: No superficial, deep or posterior cervical adenopathy.    Left cervical: No superficial, deep or posterior cervical adenopathy.     Upper Body:     Right upper body: No supraclavicular or pectoral adenopathy.     Left upper body: No supraclavicular or pectoral adenopathy.  Skin:    General: Skin is warm and dry.     Coloration: Skin is not jaundiced or pale.     Findings: No abrasion, bruising, burn, ecchymosis, erythema, lesion, petechiae or rash.     Nails: There is no clubbing.  Neurological:     Mental Status: She is alert and oriented to person, place, and time.     GCS: GCS eye subscore is 4. GCS verbal subscore is 5. GCS motor subscore is 6.     Cranial Nerves: No cranial nerve deficit.     Sensory: No sensory deficit.     Motor: No tremor, atrophy, abnormal muscle tone or seizure activity.     Coordination: Coordination normal.     Gait: Gait normal.     Deep Tendon Reflexes: Reflexes are normal and symmetric.     Reflex Scores:      Tricep reflexes are 2+ on the right side and 2+ on the left side.      Bicep reflexes are 2+ on the right side and 2+ on the left side.      Brachioradialis reflexes are 2+ on the right side and 2+ on the left side.      Patellar reflexes are 2+ on the right side and 2+ on the left side.      Achilles reflexes are 2+ on the right side and 2+ on the left side. Psychiatric:        Mood and Affect: Mood  normal.        Speech: Speech normal.        Behavior: Behavior normal.        Thought Content: Thought content normal.        Judgment: Judgment normal.     BP 128/86 (BP Location: Right Arm, Patient Position: Sitting, Cuff Size: Normal)   Pulse 76   Temp 98.6 F (37 C)   Ht 5' 1.5" (1.562 m)   Wt 174 lb (78.9 kg)   SpO2 98%   BMI 32.35 kg/m  Wt Readings from Last 3 Encounters:  11/09/20 174 lb (78.9 kg)  07/21/20 175 lb (79.4 kg)  04/22/20 173 lb 8 oz (78.7 kg)     Health Maintenance Due  Topic Date Due  . HIV Screening  Never done  . Hepatitis C Screening  Never done  . PAP SMEAR-Modifier  06/05/2017  . COVID-19 Vaccine (3 - Booster for Moderna series) 02/26/2020    There are no preventive care reminders to display for this patient.  Lab Results  Component Value Date   TSH 2.95 04/07/2019   Lab Results  Component Value Date   WBC 8.5 04/22/2020   HGB 14.5 04/22/2020   HCT 43.1 04/22/2020   MCV 89.2 04/22/2020   PLT 281 04/22/2020   Lab Results  Component Value Date   NA 139 04/22/2020   K 4.0 04/22/2020   CO2 22 04/22/2020   GLUCOSE 85  04/22/2020   BUN 21 04/22/2020   CREATININE 0.89 04/22/2020   BILITOT 0.8 04/22/2020   ALKPHOS 72 07/24/2019   AST 28 04/22/2020   ALT 34 (H) 04/22/2020   PROT 7.9 04/22/2020   ALBUMIN 4.3 07/24/2019   CALCIUM 9.8 04/22/2020   GFR 66.29 07/24/2019   Lab Results  Component Value Date   CHOL 205 (H) 04/22/2020   Lab Results  Component Value Date   HDL 55 04/22/2020   Lab Results  Component Value Date   LDLCALC 129 (H) 04/22/2020   Lab Results  Component Value Date   TRIG 106 04/22/2020   Lab Results  Component Value Date   CHOLHDL 3.7 04/22/2020   Lab Results  Component Value Date   HGBA1C 5.9 (A) 07/27/2020      Assessment & Plan:   Problem List Items Addressed This Visit      Other   Anxiety and depression   Pure hypercholesterolemia   Relevant Orders   Lipid panel   Prediabetes -  Primary   Relevant Orders   CBC with Differential/Platelet   Comprehensive metabolic panel   TSH   Hemoglobin A1c     Orders Placed This Encounter  Procedures  . CBC with Differential/Platelet  . Comprehensive metabolic panel  . TSH  . Lipid panel  . Hemoglobin A1c   CPE is due in December, labs ordered week before.  No medications needed she has refills from previous provider.  The patient is advised to begin progressive daily aerobic exercise program, follow a low fat, low cholesterol diet, attempt to lose weight, decrease or avoid alcohol intake, reduce salt in diet and cooking, reduce exposure to stress, improve dietary compliance, continue current medications, continue current healthy lifestyle patterns and return for routine annual checkups.  No orders of the defined types were placed in this encounter. Red Flags discussed. The patient was given clear instructions to go to ER or return to medical center if any red flags develop, symptoms do not improve, worsen or new problems develop. They verbalized understanding.   Follow-up: Return in about 7 months (around 06/11/2021), or if symptoms worsen or fail to improve, for at any time for any worsening symptoms, Go to Emergency room/ urgent care if worse.    Marcille Buffy, FNP

## 2020-11-09 NOTE — Patient Instructions (Signed)
Health Maintenance, Female Adopting a healthy lifestyle and getting preventive care are important in promoting health and wellness. Ask your health care provider about:  The right schedule for you to have regular tests and exams.  Things you can do on your own to prevent diseases and keep yourself healthy. What should I know about diet, weight, and exercise? Eat a healthy diet  Eat a diet that includes plenty of vegetables, fruits, low-fat dairy products, and lean protein.  Do not eat a lot of foods that are high in solid fats, added sugars, or sodium.   Maintain a healthy weight Body mass index (BMI) is used to identify weight problems. It estimates body fat based on height and weight. Your health care provider can help determine your BMI and help you achieve or maintain a healthy weight. Get regular exercise Get regular exercise. This is one of the most important things you can do for your health. Most adults should:  Exercise for at least 150 minutes each week. The exercise should increase your heart rate and make you sweat (moderate-intensity exercise).  Do strengthening exercises at least twice a week. This is in addition to the moderate-intensity exercise.  Spend less time sitting. Even light physical activity can be beneficial. Watch cholesterol and blood lipids Have your blood tested for lipids and cholesterol at 51 years of age, then have this test every 5 years. Have your cholesterol levels checked more often if:  Your lipid or cholesterol levels are high.  You are older than 51 years of age.  You are at high risk for heart disease. What should I know about cancer screening? Depending on your health history and family history, you may need to have cancer screening at various ages. This may include screening for:  Breast cancer.  Cervical cancer.  Colorectal cancer.  Skin cancer.  Lung cancer. What should I know about heart disease, diabetes, and high blood  pressure? Blood pressure and heart disease  High blood pressure causes heart disease and increases the risk of stroke. This is more likely to develop in people who have high blood pressure readings, are of African descent, or are overweight.  Have your blood pressure checked: ? Every 3-5 years if you are 18-39 years of age. ? Every year if you are 40 years old or older. Diabetes Have regular diabetes screenings. This checks your fasting blood sugar level. Have the screening done:  Once every three years after age 40 if you are at a normal weight and have a low risk for diabetes.  More often and at a younger age if you are overweight or have a high risk for diabetes. What should I know about preventing infection? Hepatitis B If you have a higher risk for hepatitis B, you should be screened for this virus. Talk with your health care provider to find out if you are at risk for hepatitis B infection. Hepatitis C Testing is recommended for:  Everyone born from 1945 through 1965.  Anyone with known risk factors for hepatitis C. Sexually transmitted infections (STIs)  Get screened for STIs, including gonorrhea and chlamydia, if: ? You are sexually active and are younger than 51 years of age. ? You are older than 51 years of age and your health care provider tells you that you are at risk for this type of infection. ? Your sexual activity has changed since you were last screened, and you are at increased risk for chlamydia or gonorrhea. Ask your health care provider   if you are at risk.  Ask your health care provider about whether you are at high risk for HIV. Your health care provider may recommend a prescription medicine to help prevent HIV infection. If you choose to take medicine to prevent HIV, you should first get tested for HIV. You should then be tested every 3 months for as long as you are taking the medicine. Pregnancy  If you are about to stop having your period (premenopausal) and  you may become pregnant, seek counseling before you get pregnant.  Take 400 to 800 micrograms (mcg) of folic acid every day if you become pregnant.  Ask for birth control (contraception) if you want to prevent pregnancy. Osteoporosis and menopause Osteoporosis is a disease in which the bones lose minerals and strength with aging. This can result in bone fractures. If you are 65 years old or older, or if you are at risk for osteoporosis and fractures, ask your health care provider if you should:  Be screened for bone loss.  Take a calcium or vitamin D supplement to lower your risk of fractures.  Be given hormone replacement therapy (HRT) to treat symptoms of menopause. Follow these instructions at home: Lifestyle  Do not use any products that contain nicotine or tobacco, such as cigarettes, e-cigarettes, and chewing tobacco. If you need help quitting, ask your health care provider.  Do not use street drugs.  Do not share needles.  Ask your health care provider for help if you need support or information about quitting drugs. Alcohol use  Do not drink alcohol if: ? Your health care provider tells you not to drink. ? You are pregnant, may be pregnant, or are planning to become pregnant.  If you drink alcohol: ? Limit how much you use to 0-1 drink a day. ? Limit intake if you are breastfeeding.  Be aware of how much alcohol is in your drink. In the [place] one drink equals one 12 oz bottle of beer (355 mL), one 5 oz glass of wine (148 mL), or one 1 oz glass of hard liquor (44 mL). General instructions  Schedule regular health, dental, and eye exams.  Stay current with your vaccines.  Tell your health care provider if: ? You often feel depressed. ? You have ever been abused or do not feel safe at home. Summary  Adopting a healthy lifestyle and getting preventive care are important in promoting health and wellness.  Follow your health care provider's instructions about healthy  diet, exercising, and getting tested or screened for diseases.  Follow your health care provider's instructions on monitoring your cholesterol and blood pressure. This information is not intended to replace advice given to you by your health care provider. Make sure you discuss any questions you have with your health care provider. Document Revised: 05/28/2018 Document Reviewed: 05/28/2018 Elsevier Patient Education  2021 Elsevier Inc.  

## 2020-11-23 ENCOUNTER — Other Ambulatory Visit: Payer: Self-pay | Admitting: Internal Medicine

## 2021-01-04 ENCOUNTER — Encounter: Payer: Self-pay | Admitting: Adult Health

## 2021-01-05 ENCOUNTER — Other Ambulatory Visit: Payer: Self-pay

## 2021-01-05 MED ORDER — BUPROPION HCL ER (XL) 150 MG PO TB24: 150.0000 mg | ORAL_TABLET | Freq: Every day | ORAL | 0 refills | Status: DC

## 2021-01-05 MED ORDER — BUSPIRONE HCL 15 MG PO TABS: 15.0000 mg | ORAL_TABLET | Freq: Three times a day (TID) | ORAL | 0 refills | Status: DC

## 2021-02-13 ENCOUNTER — Other Ambulatory Visit: Payer: Self-pay | Admitting: Adult Health

## 2021-02-13 DIAGNOSIS — Z1231 Encounter for screening mammogram for malignant neoplasm of breast: Secondary | ICD-10-CM

## 2021-03-28 ENCOUNTER — Other Ambulatory Visit: Payer: Self-pay | Admitting: Adult Health

## 2021-04-04 ENCOUNTER — Other Ambulatory Visit: Payer: Self-pay

## 2021-04-04 ENCOUNTER — Other Ambulatory Visit (INDEPENDENT_AMBULATORY_CARE_PROVIDER_SITE_OTHER): Payer: 59

## 2021-04-04 DIAGNOSIS — R7303 Prediabetes: Secondary | ICD-10-CM

## 2021-04-04 DIAGNOSIS — E7800 Pure hypercholesterolemia, unspecified: Secondary | ICD-10-CM

## 2021-04-04 LAB — COMPREHENSIVE METABOLIC PANEL
ALT: 23 U/L (ref 0–35)
AST: 22 U/L (ref 0–37)
Albumin: 4.7 g/dL (ref 3.5–5.2)
Alkaline Phosphatase: 75 U/L (ref 39–117)
BUN: 23 mg/dL (ref 6–23)
CO2: 25 mEq/L (ref 19–32)
Calcium: 9.4 mg/dL (ref 8.4–10.5)
Chloride: 102 mEq/L (ref 96–112)
Creatinine, Ser: 0.93 mg/dL (ref 0.40–1.20)
GFR: 71.09 mL/min (ref >60.00)
Glucose, Bld: 94 mg/dL (ref 70–99)
Potassium: 4.3 mEq/L (ref 3.5–5.1)
Sodium: 138 mEq/L (ref 135–145)
Total Bilirubin: 0.7 mg/dL (ref 0.2–1.2)
Total Protein: 7.5 g/dL (ref 6.0–8.3)

## 2021-04-04 LAB — CBC WITH DIFFERENTIAL/PLATELET
Basophils Absolute: 0.1 10*3/uL (ref 0.0–0.1)
Basophils Relative: 1.2 % (ref 0.0–3.0)
Eosinophils Absolute: 0.2 10*3/uL (ref 0.0–0.7)
Eosinophils Relative: 2.4 % (ref 0.0–5.0)
HCT: 42.7 % (ref 36.0–46.0)
Hemoglobin: 14.4 g/dL (ref 12.0–15.0)
Lymphocytes Relative: 39.3 % (ref 12.0–46.0)
Lymphs Abs: 2.8 10*3/uL (ref 0.7–4.0)
MCHC: 33.7 g/dL (ref 30.0–36.0)
MCV: 90.3 fl (ref 78.0–100.0)
Monocytes Absolute: 0.6 10*3/uL (ref 0.1–1.0)
Monocytes Relative: 8.8 % (ref 3.0–12.0)
Neutro Abs: 3.4 10*3/uL (ref 1.4–7.7)
Neutrophils Relative %: 48.3 % (ref 43.0–77.0)
Platelets: 284 10*3/uL (ref 150.0–400.0)
RBC: 4.73 Mil/uL (ref 3.87–5.11)
RDW: 12.7 % (ref 11.5–15.5)
WBC: 7.1 10*3/uL (ref 4.0–10.5)

## 2021-04-04 LAB — HEMOGLOBIN A1C: Hgb A1c MFr Bld: 6.2 % (ref 4.6–6.5)

## 2021-04-04 LAB — LIPID PANEL
Cholesterol: 210 mg/dL — ABNORMAL HIGH (ref 0–200)
HDL: 55.9 mg/dL (ref >39.00)
LDL Cholesterol: 136 mg/dL — ABNORMAL HIGH (ref 0–99)
NonHDL: 154.23
Total CHOL/HDL Ratio: 4
Triglycerides: 93 mg/dL (ref 0.0–149.0)
VLDL: 18.6 mg/dL (ref 0.0–40.0)

## 2021-04-04 LAB — TSH: TSH: 2.91 u[IU]/mL (ref 0.35–5.50)

## 2021-04-21 ENCOUNTER — Telehealth: Payer: Self-pay | Admitting: Adult Health

## 2021-04-21 NOTE — Telephone Encounter (Signed)
Patient returned office phone call for lab results. 

## 2021-06-05 ENCOUNTER — Ambulatory Visit
Admission: RE | Admit: 2021-06-05 | Discharge: 2021-06-05 | Disposition: A | Discharge status: Home / Self Care | Payer: 59 | Source: Ambulatory Visit | Attending: Adult Health | Admitting: Adult Health

## 2021-06-05 ENCOUNTER — Other Ambulatory Visit: Payer: Self-pay

## 2021-06-05 DIAGNOSIS — Z1231 Encounter for screening mammogram for malignant neoplasm of breast: Secondary | ICD-10-CM

## 2021-06-06 NOTE — Progress Notes (Signed)
FINDINGS: There are no findings suspicious for malignancy.  IMPRESSION: No mammographic evidence of malignancy. A result letter of this screening mammogram will be mailed directly to the patient.  RECOMMENDATION: Screening mammogram in one year. (Code:SM-B-01Y)  BI-RADS CATEGORY  1: Negative.   Electronically Signed   By: Margarette Canada M.D.   On: 06/06/2021 12:25

## 2021-06-07 ENCOUNTER — Telehealth: Payer: Self-pay

## 2021-06-07 NOTE — Telephone Encounter (Signed)
-----   Message from Doreen Beam, [place] sent at 06/06/2021  4:36 PM EST ----- FINDINGS: There are no findings suspicious for malignancy.  IMPRESSION: No mammographic evidence of malignancy. A result letter of this screening mammogram will be mailed directly to the patient.  RECOMMENDATION: Screening mammogram in one year. (Code:SM-B-01Y)  BI-RADS CATEGORY  1: Negative.   Electronically Signed   By: Margarette Canada M.D.   On: 06/06/2021 12:25

## 2021-06-26 ENCOUNTER — Encounter: Payer: Self-pay | Admitting: Adult Health

## 2021-06-26 ENCOUNTER — Other Ambulatory Visit: Payer: Self-pay

## 2021-06-26 ENCOUNTER — Ambulatory Visit (INDEPENDENT_AMBULATORY_CARE_PROVIDER_SITE_OTHER): Payer: 59 | Admitting: Adult Health

## 2021-06-26 VITALS — BP 134/86 | HR 72 | Temp 96.0°F | Resp 18 | Ht 62.21 in | Wt 174.6 lb

## 2021-06-26 DIAGNOSIS — Z6831 Body mass index (BMI) 31.0-31.9, adult: Secondary | ICD-10-CM | POA: Diagnosis not present

## 2021-06-26 DIAGNOSIS — E7800 Pure hypercholesterolemia, unspecified: Secondary | ICD-10-CM | POA: Diagnosis not present

## 2021-06-26 DIAGNOSIS — R7303 Prediabetes: Secondary | ICD-10-CM

## 2021-06-26 DIAGNOSIS — F419 Anxiety disorder, unspecified: Secondary | ICD-10-CM

## 2021-06-26 DIAGNOSIS — F32A Depression, unspecified: Secondary | ICD-10-CM

## 2021-06-26 MED ORDER — BUPROPION HCL ER (XL) 300 MG PO TB24: 300.0000 mg | ORAL_TABLET | Freq: Every day | ORAL | 1 refills | Status: DC

## 2021-06-26 MED ORDER — PRAVASTATIN SODIUM 40 MG PO TABS: 40.0000 mg | ORAL_TABLET | Freq: Every day | ORAL | 0 refills | Status: DC

## 2021-06-26 MED ORDER — BUSPIRONE HCL 15 MG PO TABS: 15.0000 mg | ORAL_TABLET | Freq: Two times a day (BID) | ORAL | 1 refills | Status: DC

## 2021-06-26 NOTE — Progress Notes (Signed)
Subjective:    Patient ID: Regina Salazar, female    DOB: [birth year], 51 y.o.   MRN: [id number]  CC: Regina Salazar is a 51 y.o. female who presents today for physical exam.    HPI: HPI  Colorectal Cancer Screening: UTD 04/2019 repeat in 5 years.  Breast Cancer Screening: Mammogram UTD last 05/2021 within normal repeat yearly.  Cervical Cancer Screening: declines any screening last one was 2014 she had to be put to sleep to do and she declined doing this again despite screening recommendations know.  Bone Health screening/DEXA for 65+: No increased fracture risk. Defer screening at this time.Not needed.   Lung Cancer Screening: Doesn't have 20 year pack year history and age > 69 years yo 77 years. Smoked in 20's for one year.   AAA screen for all men and women aged 52 to 65 with h/o tobacco, men 26 years older with family h/o AAA and women 33 years or older who have ever smoked or have family history of AAA.Declines         Tetanus - due September 02/2022 will schedule nurse visit.         Pneumococcal - Candidate for. Declined today.  Hepatitis C screening - Candidate for declined  HIV Screening- Candidate for declines  Labs: Screening labs today. Exercise: Gets regular exercise.  she is working out 3- 5 day per week.  Alcohol use:  none  Smoking/tobacco use: Nonsmoker.   Mammogram was 06/06/2021- within normal screening due again 12/20/202 unless clinically indicated.    Patient  denies any fever, body aches,chills, rash, chest pain, shortness of breath, nausea, vomiting, or diarrhea.  Denies dizziness, lightheadedness, pre syncopal or syncopal episodes.   HISTORY:  Past Medical History:  Diagnosis Date   Depression     Past Surgical History:  Procedure Laterality Date   COLONOSCOPY WITH PROPOFOL N/A 05/08/2019   Procedure: COLONOSCOPY WITH PROPOFOL;  Surgeon: Virgel Manifold, MD;  Location: [facility];  Service: Endoscopy;  Laterality: N/A;   cystectomy on  writst  1980   EXAMINATION UNDER ANESTHESIA  06/05/2012   Procedure: EXAM UNDER ANESTHESIA;  Surgeon: Donnamae Jude, MD;  Location: [facility];  Service: Gynecology;  Laterality: N/A;  pap testing   Family History  Problem Relation Age of Onset   Breast cancer Mother    Cancer Mother 38       breast   Cancer Father        colon   Diabetes Father       ALLERGIES: Prednisone  Current Outpatient Medications on File Prior to Visit  Medication Sig Dispense Refill   ALPRAZolam (XANAX) 0.5 MG tablet Take 1 tablet (0.5 mg total) by mouth daily as needed for anxiety. 1/2 to 1 tablet up to three times as needed for panic attacks and insomnia 30 tablet 0   naproxen sodium (ALEVE) 220 MG tablet Take 220 mg by mouth.     No current facility-administered medications on file prior to visit.    Social History   Tobacco Use   Smoking status: Never   Smokeless tobacco: Never  Vaping Use   Vaping Use: Never used  Substance Use Topics   Alcohol use: No   Drug use: No    Recent Results (from the past 2160 hour(s))  Hemoglobin A1c     Status: None   Collection Time: 04/04/21 10:36 AM  Result Value Ref Range   Hgb A1c MFr Bld 6.2 4.6 - 6.5 %  Comment: Glycemic Control Guidelines for People with Diabetes:Non Diabetic:  <6%Goal of Therapy: <7%Additional Action Suggested:  >8%   Lipid panel     Status: Abnormal   Collection Time: 04/04/21 10:36 AM  Result Value Ref Range   Cholesterol 210 (H) 0 - 200 mg/dL    Comment: ATP III Classification       Desirable:  < 200 mg/dL               Borderline High:  200 - 239 mg/dL          High:  > = 240 mg/dL   Triglycerides 93.0 0.0 - 149.0 mg/dL    Comment: Normal:  <150 mg/dLBorderline High:  150 - 199 mg/dL   HDL 55.90 >39.00 mg/dL   VLDL 18.6 0.0 - 40.0 mg/dL   LDL Cholesterol 136 (H) 0 - 99 mg/dL   Total CHOL/HDL Ratio 4     Comment:                Men          Women1/2 Average Risk     3.4          3.3Average Risk          5.0          4.42X  Average Risk          9.6          7.13X Average Risk          15.0          11.0                       NonHDL 154.23     Comment: NOTE:  Non-HDL goal should be 30 mg/dL higher than patient's LDL goal (i.e. LDL goal of < 70 mg/dL, would have non-HDL goal of < 100 mg/dL)  TSH     Status: None   Collection Time: 04/04/21 10:36 AM  Result Value Ref Range   TSH 2.91 0.35 - 5.50 uIU/mL  Comprehensive metabolic panel     Status: None   Collection Time: 04/04/21 10:36 AM  Result Value Ref Range   Sodium 138 135 - 145 mEq/L   Potassium 4.3 3.5 - 5.1 mEq/L   Chloride 102 96 - 112 mEq/L   CO2 25 19 - 32 mEq/L   Glucose, Bld 94 70 - 99 mg/dL   BUN 23 6 - 23 mg/dL   Creatinine, Ser 0.93 0.40 - 1.20 mg/dL   Total Bilirubin 0.7 0.2 - 1.2 mg/dL   Alkaline Phosphatase 75 39 - 117 U/L   AST 22 0 - 37 U/L   ALT 23 0 - 35 U/L   Total Protein 7.5 6.0 - 8.3 g/dL   Albumin 4.7 3.5 - 5.2 g/dL   GFR 71.09 >60.00 mL/min    Comment: Calculated using the CKD-EPI Creatinine Equation (2021)   Calcium 9.4 8.4 - 10.5 mg/dL  CBC with Differential/Platelet     Status: None   Collection Time: 04/04/21 10:36 AM  Result Value Ref Range   WBC 7.1 4.0 - 10.5 K/uL   RBC 4.73 3.87 - 5.11 Mil/uL   Hemoglobin 14.4 12.0 - 15.0 g/dL   HCT 42.7 36.0 - 46.0 %   MCV 90.3 78.0 - 100.0 fl   MCHC 33.7 30.0 - 36.0 g/dL   RDW 12.7 11.5 - 15.5 %   Platelets 284.0 150.0 - 400.0 K/uL   Neutrophils Relative %  48.3 43.0 - 77.0 %   Lymphocytes Relative 39.3 12.0 - 46.0 %   Monocytes Relative 8.8 3.0 - 12.0 %   Eosinophils Relative 2.4 0.0 - 5.0 %   Basophils Relative 1.2 0.0 - 3.0 %   Neutro Abs 3.4 1.4 - 7.7 K/uL   Lymphs Abs 2.8 0.7 - 4.0 K/uL   Monocytes Absolute 0.6 0.1 - 1.0 K/uL   Eosinophils Absolute 0.2 0.0 - 0.7 K/uL   Basophils Absolute 0.1 0.0 - 0.1 K/uL     Review of Systems  Constitutional: Negative.   HENT: Negative.    Respiratory: Negative.    Cardiovascular: Negative.   Gastrointestinal: Negative.    Genitourinary: Negative.   Musculoskeletal: Negative.   Skin: Negative.   Neurological: Negative.      Objective:    BP 134/86    Pulse 72    Temp (!) 96 F (35.6 C)    Resp 18    Ht 5' 2.21" (1.58 m)    Wt 174 lb 9.6 oz (79.2 kg)    SpO2 98%    BMI 31.72 kg/m   BP Readings from Last 3 Encounters:  06/26/21 134/86  11/09/20 128/86  07/21/20 124/74   Wt Readings from Last 3 Encounters:  06/26/21 174 lb 9.6 oz (79.2 kg)  11/09/20 174 lb (78.9 kg)  07/21/20 175 lb (79.4 kg)    Physical Exam Vitals reviewed.  Constitutional:      General: She is not in acute distress.    Appearance: Normal appearance. She is well-developed.  HENT:     Head: Normocephalic and atraumatic.     Right Ear: External ear normal.     Left Ear: External ear normal.  Eyes:     General: No scleral icterus.       Right eye: No discharge.        Left eye: No discharge.     Conjunctiva/sclera: Conjunctivae normal.  Neck:     Thyroid: No thyromegaly.  Cardiovascular:     Rate and Rhythm: Normal rate and regular rhythm.  Pulmonary:     Effort: No tachypnea, accessory muscle usage or respiratory distress.     Breath sounds: Normal breath sounds. No decreased breath sounds or wheezing.  Chest:  Breasts:    Right: Tenderness: no axillary adenopathy.     Left: Tenderness: no axilarry adenopathy.  Abdominal:     General: Bowel sounds are normal.     Palpations: Abdomen is soft.     Tenderness: There is no abdominal tenderness.  Musculoskeletal:        General: No swelling or tenderness.     Cervical back: Neck supple. No tenderness.  Lymphadenopathy:     Cervical: No cervical adenopathy.  Skin:    Findings: No erythema or rash.  Neurological:     Mental Status: She is alert and oriented to person, place, and time.  Psychiatric:        Mood and Affect: Mood normal.        Behavior: Behavior normal.        Assessment & Plan:   Problem List Items Addressed This Visit       Other    Anxiety and depression   Relevant Medications   busPIRone (BUSPAR) 15 MG tablet   buPROPion (WELLBUTRIN XL) 300 MG 24 hr tablet   Pure hypercholesterolemia - Primary   Relevant Medications   pravastatin (PRAVACHOL) 40 MG tablet   Other Relevant Orders   Comprehensive metabolic panel  Prediabetes   Body mass index (BMI) of 31.0-31.9 in adult    Ok to schedule Tdap as nurse viist. Due 02/2022.   I have discontinued Jewelle R. Heavin's pravastatin and buPROPion. I have also changed her busPIRone. Additionally, I am having her start on pravastatin and buPROPion. Lastly, I am having her maintain her ALPRAZolam and naproxen sodium.   Meds ordered this encounter  Medications   busPIRone (BUSPAR) 15 MG tablet    Sig: Take 1 tablet (15 mg total) by mouth 2 (two) times daily.    Dispense:  270 tablet    Refill:  1   pravastatin (PRAVACHOL) 40 MG tablet    Sig: Take 1 tablet (40 mg total) by mouth daily.    Dispense:  90 tablet    Refill:  0   buPROPion (WELLBUTRIN XL) 300 MG 24 hr tablet    Sig: Take 1 tablet (300 mg total) by mouth daily.    Dispense:  90 tablet    Refill:  1  Labs fasting.   Return precautions given.   Risks, benefits, and alternatives of the medications and treatment plan prescribed today were discussed, and patient expressed understanding.   Education regarding symptom management and diagnosis given to patient on AVS.   Continue to follow with Kailana Benninger, Kelby Aline, FNP for routine health maintenance.    The patient is advised to begin progressive daily aerobic exercise program, follow a low fat, low cholesterol diet, attempt to lose weight, reduce salt in diet and cooking, reduce exposure to stress, improve dietary compliance, use calcium 1 gram daily with Vit D, continue current medications, continue current healthy lifestyle patterns, and return for routine annual checkups.  Len Childs and I agreed with plan.  Red Flags discussed. The patient was given  clear instructions to go to ER or return to medical center if any red flags develop, symptoms do not improve, worsen or new problems develop. They verbalized understanding.   Return in about 3 months (around 09/24/2021), or if symptoms worsen or fail to improve, for at any time for any worsening symptoms, Go to Emergency room/ urgent care if worse.  Marcille Buffy, FNP

## 2021-06-26 NOTE — Patient Instructions (Signed)
Health Maintenance, Female °Adopting a healthy lifestyle and getting preventive care are important in promoting health and wellness. Ask your health care provider about: °The right schedule for you to have regular tests and exams. °Things you can do on your own to prevent diseases and keep yourself healthy. °What should I know about diet, weight, and exercise? °Eat a healthy diet ° °Eat a diet that includes plenty of vegetables, fruits, low-fat dairy products, and lean protein. °Do not eat a lot of foods that are high in solid fats, added sugars, or sodium. °Maintain a healthy weight °Body mass index (BMI) is used to identify weight problems. It estimates body fat based on height and weight. Your health care provider can help determine your BMI and help you achieve or maintain a healthy weight. °Get regular exercise °Get regular exercise. This is one of the most important things you can do for your health. Most adults should: °Exercise for at least 150 minutes each week. The exercise should increase your heart rate and make you sweat (moderate-intensity exercise). °Do strengthening exercises at least twice a week. This is in addition to the moderate-intensity exercise. °Spend less time sitting. Even light physical activity can be beneficial. °Watch cholesterol and blood lipids °Have your blood tested for lipids and cholesterol at 52 years of age, then have this test every 5 years. °Have your cholesterol levels checked more often if: °Your lipid or cholesterol levels are high. °You are older than 52 years of age. °You are at high risk for heart disease. °What should I know about cancer screening? °Depending on your health history and family history, you may need to have cancer screening at various ages. This may include screening for: °Breast cancer. °Cervical cancer. °Colorectal cancer. °Skin cancer. °Lung cancer. °What should I know about heart disease, diabetes, and high blood pressure? °Blood pressure and heart  disease °High blood pressure causes heart disease and increases the risk of stroke. This is more likely to develop in people who have high blood pressure readings or are overweight. °Have your blood pressure checked: °Every 3-5 years if you are 18-39 years of age. °Every year if you are 40 years old or older. °Diabetes °Have regular diabetes screenings. This checks your fasting blood sugar level. Have the screening done: °Once every three years after age 40 if you are at a normal weight and have a low risk for diabetes. °More often and at a younger age if you are overweight or have a high risk for diabetes. °What should I know about preventing infection? °Hepatitis B °If you have a higher risk for hepatitis B, you should be screened for this virus. Talk with your health care provider to find out if you are at risk for hepatitis B infection. °Hepatitis C °Testing is recommended for: °Everyone born from 1945 through 1965. °Anyone with known risk factors for hepatitis C. °Sexually transmitted infections (STIs) °Get screened for STIs, including gonorrhea and chlamydia, if: °You are sexually active and are younger than 52 years of age. °You are older than 52 years of age and your health care provider tells you that you are at risk for this type of infection. °Your sexual activity has changed since you were last screened, and you are at increased risk for chlamydia or gonorrhea. Ask your health care provider if you are at risk. °Ask your health care provider about whether you are at high risk for HIV. Your health care provider may recommend a prescription medicine to help prevent HIV   infection. If you choose to take medicine to prevent HIV, you should first get tested for HIV. You should then be tested every 3 months for as long as you are taking the medicine. Pregnancy If you are about to stop having your period (premenopausal) and you may become pregnant, seek counseling before you get pregnant. Take 400 to 800  micrograms (mcg) of folic acid every day if you become pregnant. Ask for birth control (contraception) if you want to prevent pregnancy. Osteoporosis and menopause Osteoporosis is a disease in which the bones lose minerals and strength with aging. This can result in bone fractures. If you are 5 years old or older, or if you are at risk for osteoporosis and fractures, ask your health care provider if you should: Be screened for bone loss. Take a calcium or vitamin D supplement to lower your risk of fractures. Be given hormone replacement therapy (HRT) to treat symptoms of menopause. Follow these instructions at home: Alcohol use Do not drink alcohol if: Your health care provider tells you not to drink. You are pregnant, may be pregnant, or are planning to become pregnant. If you drink alcohol: Limit how much you have to: 0-1 drink a day. Know how much alcohol is in your drink. In the [place] one drink equals one 12 oz bottle of beer (355 mL), one 5 oz glass of wine (148 mL), or one 1 oz glass of hard liquor (44 mL). Lifestyle Do not use any products that contain nicotine or tobacco. These products include cigarettes, chewing tobacco, and vaping devices, such as e-cigarettes. If you need help quitting, ask your health care provider. Do not use street drugs. Do not share needles. Ask your health care provider for help if you need support or information about quitting drugs. General instructions Schedule regular health, dental, and eye exams. Stay current with your vaccines. Tell your health care provider if: You often feel depressed. You have ever been abused or do not feel safe at home. Summary Adopting a healthy lifestyle and getting preventive care are important in promoting health and wellness. Follow your health care provider's instructions about healthy diet, exercising, and getting tested or screened for diseases. Follow your health care provider's instructions on monitoring your  cholesterol and blood pressure. This information is not intended to replace advice given to you by your health care provider. Make sure you discuss any questions you have with your health care provider. Document Revised: 10/24/2020 Document Reviewed: 10/24/2020 Elsevier Patient Education  [place]. High Cholesterol High cholesterol is a condition in which the blood has high levels of a white, waxy substance similar to fat (cholesterol). The liver makes all the cholesterol that the body needs. The human body needs small amounts of cholesterol to help build cells. A person gets extra or excess cholesterol from the food that he or she eats. The blood carries cholesterol from the liver to the rest of the body. If you have high cholesterol, deposits (plaques) may build up on the walls of your arteries. Arteries are the blood vessels that carry blood away from your heart. These plaques make the arteries narrow and stiff. Cholesterol plaques increase your risk for heart attack and stroke. Work with your health care provider to keep your cholesterol levels in a healthy range. What increases the risk? The following factors may make you more likely to develop this condition: Eating foods that are high in animal fat (saturated fat) or cholesterol. Being overweight. Not getting enough exercise. A  family history of high cholesterol (familial hypercholesterolemia). Use of tobacco products. Having diabetes. What are the signs or symptoms? In most cases, high cholesterol does not usually cause any symptoms. In severe cases, very high cholesterol levels can cause: Fatty bumps under the skin (xanthomas). A white or gray ring around the black center (pupil) of the eye. How is this diagnosed? This condition may be diagnosed based on the results of a blood test. If you are older than 52 years of age, your health care provider may check your cholesterol levels every 4-6 years. You may be checked more  often if you have high cholesterol or other risk factors for heart disease. The blood test for cholesterol measures: "Bad" cholesterol, or LDL cholesterol. This is the main type of cholesterol that causes heart disease. The desired level is less than 100 mg/dL (2.59 mmol/L). "Good" cholesterol, or HDL cholesterol. HDL helps protect against heart disease by cleaning the arteries and carrying the LDL to the liver for processing. The desired level for HDL is 60 mg/dL (1.55 mmol/L) or higher. Triglycerides. These are fats that your body can store or burn for energy. The desired level is less than 150 mg/dL (1.69 mmol/L). Total cholesterol. This measures the total amount of cholesterol in your blood and includes LDL, HDL, and triglycerides. The desired level is less than 200 mg/dL (5.17 mmol/L). How is this treated? Treatment for high cholesterol starts with lifestyle changes, such as diet and exercise. Diet changes. You may be asked to eat foods that have more fiber and less saturated fats or added sugar. Lifestyle changes. These may include regular exercise, maintaining a healthy weight, and quitting use of tobacco products. Medicines. These are given when diet and lifestyle changes have not worked. You may be prescribed a statin medicine to help lower your cholesterol levels. Follow these instructions at home: Eating and drinking  Eat a healthy, balanced diet. This diet includes: Daily servings of a variety of fresh, frozen, or canned fruits and vegetables. Daily servings of whole grain foods that are rich in fiber. Foods that are low in saturated fats and trans fats. These include poultry and fish without skin, lean cuts of meat, and low-fat dairy products. A variety of fish, especially oily fish that contain omega-3 fatty acids. Aim to eat fish at least 2 times a week. Avoid foods and drinks that have added sugar. Use healthy cooking methods, such as roasting, grilling, broiling, baking,  poaching, steaming, and stir-frying. Do not fry your food except for stir-frying. If you drink alcohol: Limit how much you have to: 0-1 drink a day for women who are not pregnant. 0-2 drinks a day for men. Know how much alcohol is in a drink. In the [place] one drink equals one 12 oz bottle of beer (355 mL), one 5 oz glass of wine (148 mL), or one 1 oz glass of hard liquor (44 mL). Lifestyle  Get regular exercise. Aim to exercise for a total of 150 minutes a week. Increase your activity level by doing activities such as gardening, walking, and taking the stairs. Do not use any products that contain nicotine or tobacco. These products include cigarettes, chewing tobacco, and vaping devices, such as e-cigarettes. If you need help quitting, ask your health care provider. General instructions Take over-the-counter and prescription medicines only as told by your health care provider. Keep all follow-up visits. This is important. Where to find more information American Heart Association: www.heart.org National Heart, Lung, and Blood Institute: https://wilson-eaton.com/ Contact  a health care provider if: You have trouble achieving or maintaining a healthy diet or weight. You are starting an exercise program. You are unable to stop smoking. Get help right away if: You have chest pain. You have trouble breathing. You have discomfort or pain in your jaw, neck, back, shoulder, or arm. You have any symptoms of a stroke. "BE FAST" is an easy way to remember the main warning signs of a stroke: B - Balance. Signs are dizziness, sudden trouble walking, or loss of balance. E - Eyes. Signs are trouble seeing or a sudden change in vision. F - Face. Signs are sudden weakness or numbness of the face, or the face or eyelid drooping on one side. A - Arms. Signs are weakness or numbness in an arm. This happens suddenly and usually on one side of the body. S - Speech. Signs are sudden trouble speaking, slurred speech, or  trouble understanding what people say. T - Time. Time to call emergency services. Write down what time symptoms started. You have other signs of a stroke, such as: A sudden, severe headache with no known cause. Nausea or vomiting. Seizure. These symptoms may represent a serious problem that is an emergency. Do not wait to see if the symptoms will go away. Get medical help right away. Call your local emergency services (911 in the [place] Do not drive yourself to the hospital. Summary Cholesterol plaques increase your risk for heart attack and stroke. Work with your health care provider to keep your cholesterol levels in a healthy range. Eat a healthy, balanced diet, get regular exercise, and maintain a healthy weight. Do not use any products that contain nicotine or tobacco. These products include cigarettes, chewing tobacco, and vaping devices, such as e-cigarettes. Get help right away if you have any symptoms of a stroke. This information is not intended to replace advice given to you by your health care provider. Make sure you discuss any questions you have with your health care provider. Document Revised: 08/18/2020 Document Reviewed: 08/08/2020 Elsevier Patient Education  [place]. Bupropion Extended-Release Tablets (Depression/Mood Disorders) What is this medication? BUPROPION (byoo PROE pee on) treats depression. It increases norepinephrine and dopamine in the brain, hormones that help regulate mood. It belongs to a group of medications called NDRIs. This medicine may be used for other purposes; ask your health care provider or pharmacist if you have questions. COMMON BRAND NAME(S): Aplenzin, Budeprion XL, Forfivo XL, Wellbutrin XL What should I tell my care team before I take this medication? They need to know if you have any of these conditions: An eating disorder, such as anorexia or bulimia Bipolar disorder or psychosis Diabetes or high blood sugar, treated with  medication Glaucoma Head injury or brain tumor Heart disease, previous heart attack, or irregular heart beat High blood pressure Kidney or liver disease Seizures (convulsions) Suicidal thoughts or a previous suicide attempt Tourette's syndrome Weight loss An unusual or allergic reaction to bupropion, other medications, foods, dyes, or preservatives Pregnant or trying to become pregnant Breast-feeding How should I use this medication? Take this medication by mouth with a glass of water. Follow the directions on the prescription label. You can take it with or without food. If it upsets your stomach, take it with food. Do not crush, chew, or cut these tablets. This medication is taken once daily at the same time each day. Do not take your medication more often than directed. Do not stop taking this medication suddenly except upon the  advice of your care team. Stopping this medication too quickly may cause serious side effects or your condition may worsen. A special MedGuide will be given to you by the pharmacist with each prescription and refill. Be sure to read this information carefully each time. Talk to your care team about the use of this medication in children. Special care may be needed. Overdosage: If you think you have taken too much of this medicine contact a poison control center or emergency room at once. NOTE: This medicine is only for you. Do not share this medicine with others. What if I miss a dose? If you miss a dose, skip the missed dose and take your next tablet at the regular time. Do not take double or extra doses. What may interact with this medication? Do not take this medication with any of the following: Linezolid MAOIs like Azilect, Carbex, Eldepryl, Marplan, Nardil, and Parnate Methylene blue (injected into a vein) Other medications that contain bupropion like Zyban This medication may also interact with the following: Alcohol Certain medications for anxiety or  sleep Certain medications for blood pressure like metoprolol, propranolol Certain medications for depression or psychotic disturbances Certain medications for HIV or AIDS like efavirenz, lopinavir, nelfinavir, ritonavir Certain medications for irregular heart beat like propafenone, flecainide Certain medications for Parkinson's disease like amantadine, levodopa Certain medications for seizures like carbamazepine, phenytoin, phenobarbital Cimetidine Clopidogrel Cyclophosphamide Digoxin Furazolidone Isoniazid Nicotine Orphenadrine Procarbazine Steroid medications like prednisone or cortisone Stimulant medications for attention disorders, weight loss, or to stay awake Tamoxifen Theophylline Thiotepa Ticlopidine Tramadol Warfarin This list may not describe all possible interactions. Give your health care provider a list of all the medicines, herbs, non-prescription drugs, or dietary supplements you use. Also tell them if you smoke, drink alcohol, or use illegal drugs. Some items may interact with your medicine. What should I watch for while using this medication? Tell your care team if your symptoms do not get better or if they get worse. Visit your care team for regular checks on your progress. Because it may take several weeks to see the full effects of this medication, it is important to continue your treatment as prescribed. Watch for new or worsening thoughts of suicide or depression. This includes sudden changes in mood, behavior, or thoughts. These changes can happen at any time but are more common in the beginning of treatment or after a change in dose. Call your care team right away if you experience these thoughts or worsening depression. Manic episodes may happen in patients with bipolar disorder who take this medication. Watch for changes in feelings or behaviors such as feeling anxious, nervous, agitated, panicky, irritable, hostile, aggressive, impulsive, severely restless,  overly excited and hyperactive, or trouble sleeping. These symptoms can happen at anytime but are more common in the beginning of treatment or after a change in dose. Call your care team right away if you notice any of these symptoms. This medication may cause serious skin reactions. They can happen weeks to months after starting the medication. Contact your care team right away if you notice fevers or flu-like symptoms with a rash. The rash may be red or purple and then turn into blisters or peeling of the skin. Or, you might notice a red rash with swelling of the face, lips or lymph nodes in your neck or under your arms. Avoid drinks that contain alcohol while taking this medication. Drinking large amounts of alcohol, using sleeping or anxiety medications, or quickly stopping the use of  these agents while taking this medication may increase your risk for a seizure. Do not drive or use heavy machinery until you know how this medication affects you. This medication can impair your ability to perform these tasks. Do not take this medication close to bedtime. It may prevent you from sleeping. Your mouth may get dry. Chewing sugarless gum or sucking hard candy, and drinking plenty of water may help. Contact your care team if the problem does not go away or is severe. The tablet shell for some brands of this medication does not dissolve. This is normal. The tablet shell may appear whole in the stool. This is not a cause for concern. What side effects may I notice from receiving this medication? Side effects that you should report to your care team as soon as possible: Allergic reactions--skin rash, itching, hives, swelling of the face, lips, tongue, or throat Increase in blood pressure Mood and behavior changes--anxiety, nervousness, confusion, hallucinations, irritability, hostility, thoughts of suicide or self-harm, worsening mood, feelings of depression Redness, blistering, peeling, or loosening of the  skin, including inside the mouth Seizures Sudden eye pain or change in vision such as blurry vision, seeing halos around lights, vision loss Side effects that usually do not require medical attention (report to your care team if they continue or are bothersome): Constipation Dizziness Dry mouth Loss of appetite Nausea Tremors or shaking Trouble sleeping This list may not describe all possible side effects. Call your doctor for medical advice about side effects. You may report side effects to FDA at 1-800-FDA-1088. Where should I keep my medication? Keep out of the reach of children and pets. Store at room temperature between 15 and 30 degrees C (59 and 86 degrees F). Throw away any unused medication after the expiration date. NOTE: This sheet is a summary. It may not cover all possible information. If you have questions about this medicine, talk to your doctor, pharmacist, or health care provider.  2022 Elsevier/Gold Standard (2020-08-17 00:00:00)

## 2021-06-28 DIAGNOSIS — Z6831 Body mass index (BMI) 31.0-31.9, adult: Secondary | ICD-10-CM | POA: Insufficient documentation

## 2021-06-30 ENCOUNTER — Encounter: Payer: Self-pay | Admitting: Adult Health

## 2021-07-23 ENCOUNTER — Encounter: Payer: Self-pay | Admitting: Adult Health

## 2021-08-07 ENCOUNTER — Other Ambulatory Visit (INDEPENDENT_AMBULATORY_CARE_PROVIDER_SITE_OTHER): Payer: 59

## 2021-08-07 ENCOUNTER — Other Ambulatory Visit: Payer: Self-pay

## 2021-08-07 DIAGNOSIS — E7800 Pure hypercholesterolemia, unspecified: Secondary | ICD-10-CM | POA: Diagnosis not present

## 2021-08-07 LAB — COMPREHENSIVE METABOLIC PANEL
ALT: 33 U/L (ref 0–35)
AST: 28 U/L (ref 0–37)
Albumin: 4.7 g/dL (ref 3.5–5.2)
Alkaline Phosphatase: 90 U/L (ref 39–117)
BUN: 21 mg/dL (ref 6–23)
CO2: 23 mEq/L (ref 19–32)
Calcium: 9.8 mg/dL (ref 8.4–10.5)
Chloride: 102 mEq/L (ref 96–112)
Creatinine, Ser: 1.07 mg/dL (ref 0.40–1.20)
GFR: 59.94 mL/min — ABNORMAL LOW (ref >60.00)
Glucose, Bld: 107 mg/dL — ABNORMAL HIGH (ref 70–99)
Potassium: 4.6 mEq/L (ref 3.5–5.1)
Sodium: 136 mEq/L (ref 135–145)
Total Bilirubin: 0.4 mg/dL (ref 0.2–1.2)
Total Protein: 8.1 g/dL (ref 6.0–8.3)

## 2021-08-14 ENCOUNTER — Telehealth: Payer: Self-pay

## 2021-08-14 NOTE — Telephone Encounter (Signed)
Lm for pt to cb re: results

## 2021-08-14 NOTE — Progress Notes (Signed)
Liver enzymes stable on statin.  GFR kidney function mildly decreased and will need to recheck CMP in 6 weeks. Avoid NSAIDS, and increase hydration with water.  Please add lab and schedule.

## 2021-08-31 ENCOUNTER — Ambulatory Visit (INDEPENDENT_AMBULATORY_CARE_PROVIDER_SITE_OTHER): Payer: 59

## 2021-08-31 ENCOUNTER — Other Ambulatory Visit: Payer: Self-pay

## 2021-08-31 ENCOUNTER — Ambulatory Visit (INDEPENDENT_AMBULATORY_CARE_PROVIDER_SITE_OTHER): Payer: 59 | Admitting: Adult Health

## 2021-08-31 ENCOUNTER — Ambulatory Visit: Payer: 59 | Admitting: Adult Health

## 2021-08-31 ENCOUNTER — Encounter: Payer: Self-pay | Admitting: Adult Health

## 2021-08-31 VITALS — BP 130/82 | HR 87 | Temp 98.2°F | Ht 62.0 in | Wt 171.4 lb

## 2021-08-31 DIAGNOSIS — J40 Bronchitis, not specified as acute or chronic: Secondary | ICD-10-CM | POA: Diagnosis not present

## 2021-08-31 DIAGNOSIS — E7800 Pure hypercholesterolemia, unspecified: Secondary | ICD-10-CM

## 2021-08-31 DIAGNOSIS — R051 Acute cough: Secondary | ICD-10-CM | POA: Diagnosis not present

## 2021-08-31 MED ORDER — AZITHROMYCIN 250 MG PO TABS: ORAL_TABLET | ORAL | 0 refills | Status: AC

## 2021-08-31 MED ORDER — ALBUTEROL SULFATE HFA 108 (90 BASE) MCG/ACT IN AERS: 1.0000 | INHALATION_SPRAY | Freq: Four times a day (QID) | RESPIRATORY_TRACT | 0 refills | Status: DC | PRN

## 2021-08-31 MED ORDER — PRAVASTATIN SODIUM 40 MG PO TABS: 40.0000 mg | ORAL_TABLET | Freq: Every day | ORAL | 0 refills | Status: DC

## 2021-08-31 NOTE — Progress Notes (Signed)
? ?Established Patient Office Visit ? ?Subjective:  ?Patient ID: Regina Salazar, female    DOB: 12-21-1970  Age: 51 y.o. MRN: [id number] ? ?CC:  ?Chief Complaint  ?Patient presents with  ? Follow-up  ? ? ?HPI ?Regina Salazar presents for refills. 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 Productive green sputum.  ? ?No nasal congestion. Chest congestion only.  ?She is a Public relations account executive. No other known exposures or other recent illness.mild wheezing reported none today, feels well otherwise.  ?Patient  denies any fever, body aches,chills, rash, chest pain, shortness of breath, nausea, vomiting, or diarrhea.  ?Denies dizziness, lightheadedness, pre syncopal or syncopal episodes.  ? ? ?Past Medical History:  ?Diagnosis Date  ? Depression   ? ? ?Past Surgical History:  ?Procedure Laterality Date  ? COLONOSCOPY WITH PROPOFOL N/A 05/08/2019  ? Procedure: COLONOSCOPY WITH PROPOFOL;  Surgeon: Virgel Manifold, MD;  Location: [facility];  Service: Endoscopy;  Laterality: N/A;  ? cystectomy on writst  1980  ? EXAMINATION UNDER ANESTHESIA  06/05/2012  ? Procedure: EXAM UNDER ANESTHESIA;  Surgeon: Donnamae Jude, MD;  Location: [facility];  Service: Gynecology;  Laterality: N/A;  pap testing  ? ? ?Family History  ?Problem Relation Age of Onset  ? Breast cancer Mother   ? Cancer Mother 34  ?     breast  ? Cancer Father   ?     colon  ? Diabetes Father   ? ? ?Social History  ? ?Socioeconomic History  ? Marital status: Married  ?  Spouse name: Not on file  ? Number of children: Not on file  ? Years of education: Not on file  ? Highest education level: Not on file  ?Occupational History  ? Not on file  ?Tobacco Use  ? Smoking status: Never  ? Smokeless tobacco: Never  ?Vaping Use  ? Vaping Use: Never used  ?Substance and Sexual Activity  ? Alcohol use: No  ? Drug use: No  ? Sexual activity: Yes  ?  Partners: Male  ?  Birth control/protection: None  ?Other Topics Concern  ? Not on file  ?Social History Narrative  ? Moved  here from [place] last year for her husband's job.  ? 3 adopted children ages 55, 79, 30.  ?   ? Works at a daycare.  ? ?Social Determinants of Health  ? ?Financial Resource Strain: Not on file  ?Food Insecurity: Not on file  ?Transportation Needs: Not on file  ?Physical Activity: Not on file  ?Stress: Not on file  ?Social Connections: Not on file  ?Intimate Partner Violence: Not on file  ? ? ?Outpatient Medications Prior to Visit  ?Medication Sig Dispense Refill  ? ALPRAZolam (XANAX) 0.5 MG tablet Take 1 tablet (0.5 mg total) by mouth daily as needed for anxiety. 1/2 to 1 tablet up to three times as needed for panic attacks and insomnia 30 tablet 0  ? buPROPion (WELLBUTRIN XL) 300 MG 24 hr tablet Take 1 tablet (300 mg total) by mouth daily. 90 tablet 1  ? busPIRone (BUSPAR) 15 MG tablet Take 1 tablet (15 mg total) by mouth 2 (two) times daily. 270 tablet 1  ? naproxen sodium (ALEVE) 220 MG tablet Take 220 mg by mouth. (Patient not taking: Reported on 08/31/2021)    ? pravastatin (PRAVACHOL) 40 MG tablet Take 1 tablet (40 mg total) by mouth daily. (Patient not taking: Reported on 08/31/2021) 90 tablet 0  ? ?No facility-administered medications  prior to visit.  ? ? ?Allergies  ?Allergen Reactions  ? Prednisone Anxiety  ?  Induces her anxiety  ? ? ?ROS ?Review of Systems  ?Constitutional:  Positive for fatigue. Negative for activity change, appetite change, chills, diaphoresis, fever and unexpected weight change.  ?HENT:  Positive for congestion. Negative for dental problem, drooling, ear discharge, ear pain, facial swelling, hearing loss, mouth sores, nosebleeds, postnasal drip, rhinorrhea, sinus pressure, sinus pain, sneezing, sore throat, tinnitus, trouble swallowing and voice change.   ?Respiratory:  Positive for cough. Negative for apnea, choking, chest tightness, shortness of breath, wheezing and stridor.   ?Cardiovascular: Negative.   ?Gastrointestinal: Negative.   ?Genitourinary: Negative.   ?Musculoskeletal:  Negative.   ?Skin: Negative.   ?Neurological: Negative.   ?Hematological: Negative.   ?Psychiatric/Behavioral: Negative.    ? ?  ?Objective:  ?  ?Physical Exam ? ?BP 130/82 (BP Location: Left Arm, Patient Position: Sitting, Cuff Size: Normal)   Pulse 87   Temp 98.2 ?F (36.8 ?C) (Oral)   Ht '5\' 2"'$  (1.575 m)   Wt 171 lb 6.4 oz (77.7 kg)   LMP 08/10/2021 (Approximate)   SpO2 97%   BMI 31.35 kg/m?  ?Wt Readings from Last 3 Encounters:  ?08/31/21 171 lb 6.4 oz (77.7 kg)  ?06/26/21 174 lb 9.6 oz (79.2 kg)  ?11/09/20 174 lb (78.9 kg)  ? ? ? ?General: Appearance:    Obese female in no acute distress  ?Eyes:    PERRL, conjunctiva/corneas clear, EOM's intact       ?Lungs:     Clear to auscultation bilaterally, respirations unlabored  ?Heart:    Normal heart rate. Normal rhythm. No murmurs, rubs, or gallops.  ?  ?MS:   All extremities are intact.  ?  ?Neurologic:   Awake, alert, oriented x 3. No apparent focal neurological           defect.   ? Abdomen: soft and non-tender without masses, organomegaly or hernias noted.  No guarding or rebound  ? ? ?There are no preventive care reminders to display for this patient. ? ? ?There are no preventive care reminders to display for this patient. ? ?Lab Results  ?Component Value Date  ? TSH 2.91 04/04/2021  ? ?Lab Results  ?Component Value Date  ? WBC 8.7 08/31/2021  ? HGB 14.5 08/31/2021  ? HCT 43.3 08/31/2021  ? MCV 89.3 08/31/2021  ? PLT 272.0 08/31/2021  ? ?Lab Results  ?Component Value Date  ? NA 137 08/31/2021  ? K 3.8 08/31/2021  ? CO2 18 (L) 08/31/2021  ? GLUCOSE 84 08/31/2021  ? BUN 18 08/31/2021  ? CREATININE 0.95 08/31/2021  ? BILITOT 0.4 08/31/2021  ? ALKPHOS 78 08/31/2021  ? AST 30 08/31/2021  ? ALT 30 08/31/2021  ? PROT 7.7 08/31/2021  ? ALBUMIN 4.5 08/31/2021  ? CALCIUM 9.4 08/31/2021  ? GFR 69.10 08/31/2021  ? ?Lab Results  ?Component Value Date  ? CHOL 210 (H) 04/04/2021  ? ?Lab Results  ?Component Value Date  ? HDL 55.90 04/04/2021  ? ?Lab Results  ?Component  Value Date  ? LDLCALC 136 (H) 04/04/2021  ? ?Lab Results  ?Component Value Date  ? TRIG 93.0 04/04/2021  ? ?Lab Results  ?Component Value Date  ? CHOLHDL 4 04/04/2021  ? ?Lab Results  ?Component Value Date  ? HGBA1C 6.2 04/04/2021  ? ? ?  ?Assessment & Plan:  ? ?Problem List Items Addressed This Visit   ? ?  ? Other  ?  Pure hypercholesterolemia - Primary  ? Relevant Medications  ? pravastatin (PRAVACHOL) 40 MG tablet  ? ?Other Visit Diagnoses   ? ? Acute cough      ? Relevant Orders  ? DG Chest 2 View (Completed)  ? CBC with Differential/Platelet (Completed)  ? Comprehensive metabolic panel (Completed)  ? Bronchitis      ? ?  ?She prefers to stay on her pravastatin until establishing with new PCP as my last day is 09/07/21 discussed transitioning to Crestor or lipid clinic if no improvement. Diet and lifestyle changes advised.  ? ?Chest x ray to rule pneumonia given duration.  ?Discussed taking allergy daily medication 2nd generation antihistamine like Zyrtec.  ? ?Albuterol if needed for history of mild wheezing - none on exam today.  ?Meds ordered this encounter  ?Medications  ? pravastatin (PRAVACHOL) 40 MG tablet  ?  Sig: Take 1 tablet (40 mg total) by mouth daily.  ?  Dispense:  90 tablet  ?  Refill:  0  ? azithromycin (ZITHROMAX) 250 MG tablet  ?  Sig: Take 2 tablets on day 1, then 1 tablet daily on days 2 through 5  ?  Dispense:  6 tablet  ?  Refill:  0  ? albuterol (VENTOLIN HFA) 108 (90 Base) MCG/ACT inhaler  ?  Sig: Inhale 1-2 puffs into the lungs every 6 (six) hours as needed for wheezing.  ?  Dispense:  8 g  ?  Refill:  0  ? ?Red Flags discussed. The patient was given clear instructions to go to ER or return to medical center if any red flags develop, symptoms do not improve, worsen or new problems develop. They verbalized understanding. ? ?Follow-up: Return in about 1 month (around 10/01/2021), or if symptoms worsen or fail to improve, for at any time for any worsening symptoms, Go to Emergency room/  urgent care if worse.  ? ? ?Patient is aware that his primary care provider is leaving the office and last day will be September 07, 2021 and he will need to establish care with a new primary care provider, list is availa

## 2021-08-31 NOTE — Progress Notes (Signed)
Patient stated that she has had a cough for the past 4 weeks and sometimes it is dry and sometimes she coughs up green mucus as well. Patient stated that she sometimes gets dizzy when she coughs also ?

## 2021-08-31 NOTE — Patient Instructions (Signed)
Albuterol Metered Dose Inhaler (MDI) ?What is this medication? ?ALBUTEROL (al Normajean [place]) treats lung diseases, such as asthma, where the airways in the lungs narrow, causing breathing problems or wheezing (bronchospasm). It is also used to treat asthma or prevent breathing problems during exercise. This medication works by opening the airways of the lungs, making it easier to breathe. It is often called a rescue- or quick-relief inhaler. ?This medicine may be used for other purposes; ask your health care provider or pharmacist if you have questions. ?COMMON BRAND NAME(S): Proair HFA, Proventil, Proventil HFA, Respirol, Ventolin, Ventolin HFA ?What should I tell my care team before I take this medication? ?They need to know if you have any of these conditions: ?Diabetes (high blood sugar) ?Heart disease ?High blood pressure ?Irregular heartbeat or rhythm ?Pheochromocytoma ?Seizures ?Thyroid disease ?An unusual or allergic reaction to albuterol, other medications, foods, dyes, or preservatives ?Pregnant or trying to get pregnant ?Breast-feeding ?How should I use this medication? ?This medication is inhaled through the mouth. Take it as directed on the prescription label. Do not use it more often than directed. ?This medication comes with INSTRUCTIONS FOR USE. Ask your pharmacist for directions on how to use this medication. Read the information carefully. Talk to your pharmacist or care team if you have questions. ?Talk to your care team about the use of this medication in children. While it may be given to children for selected conditions, precautions do apply. ?Overdosage: If you think you have taken too much of this medicine contact a poison control center or emergency room at once. ?NOTE: This medicine is only for you. Do not share this medicine with others. ?What if I miss a dose? ?If you take this medication on a regular basis, take it as soon as you can. If it is almost time for your next dose, take only  that dose. Do not take double or extra doses. ?What may interact with this medication? ?Caffeine ?Chloroquine ?Cisapride ?Diuretics ?Medications for colds ?Medications for depression or for emotional or psychotic conditions ?Medications for weight loss including some herbal products ?Methadone ?Pentamidine ?Some antibiotics like clarithromycin, erythromycin, levofloxacin, and linezolid ?Some heart medications ?Steroid hormones like dexamethasone, cortisone, hydrocortisone ?Theophylline ?Thyroid hormones ?This list may not describe all possible interactions. Give your health care provider a list of all the medicines, herbs, non-prescription drugs, or dietary supplements you use. Also tell them if you smoke, drink alcohol, or use illegal drugs. Some items may interact with your medicine. ?What should I watch for while using this medication? ?Visit your care team for regular checks on your progress. Tell your care team if your symptoms do not start to get better or if they get worse. ?If your symptoms get worse or if you are using this medication more than normal, call your care team right away. ?Do not treat yourself for coughs, colds or allergies without asking your care team for advice. Some nonprescription medications can affect this one. ?You and your care team should develop an Asthma Action Plan that is just for you. Be sure to know what to do if you are in the yellow (asthma is getting worse) or red (medical alert) zones. ?Your mouth may get dry. Chewing sugarless gum or sucking hard candy and drinking plenty of water may help. Contact your health care provider if the problem does not go away or is severe. ?What side effects may I notice from receiving this medication? ?Side effects that you should report to your care team as  soon as possible: ?Allergic reactions--skin rash, itching, hives, swelling of the face, lips, tongue, or throat ?Heart rhythm changes--fast or irregular heartbeat, dizziness, feeling faint  or lightheaded, chest pain, trouble breathing ?Increase in blood pressure ?Muscle pain or cramps ?Wheezing or trouble breathing that is worse after use ?Side effects that usually do not require medical attention (report to your care team if they continue or are bothersome): ?Change in taste ?Dry mouth ?Headache ?Sore throat ?Tremors or shaking ?Trouble sleeping ?This list may not describe all possible side effects. Call your doctor for medical advice about side effects. You may report side effects to FDA at 1-800-FDA-1088. ?Where should I keep my medication? ?Keep out of the reach of children and pets. ?Store at room temperature between 20 and 25 degrees C (68 and 77 degrees F). Keep inhaler away from extreme heat and cold. Get rid of it when the dose counter reads 0 or after the expiration date, whichever is first. ?To get rid of medications that are no longer needed or have expired: ?Take the medication to a medication take-back program. Check with your pharmacy or law enforcement to find a location. ?If you cannot return the medication, ask your care team how to get rid of this medication safely. ?NOTE: This sheet is a summary. It may not cover all possible information. If you have questions about this medicine, talk to your doctor, pharmacist, or health care provider. ?? 2022 Elsevier/Gold Standard (2020-05-20 00:00:00) ?Azithromycin Tablets ?What is this medication? ?AZITHROMYCIN (az ith roe MYE sin) treats infections caused by bacteria. It belongs to a group of medications called antibiotics. It will not treat colds, the flu, or infections caused by viruses. ?This medicine may be used for other purposes; ask your health care provider or pharmacist if you have questions. ?COMMON BRAND NAME(S): Zithromax, Zithromax Tri-Pak, Zithromax Z-Pak ?What should I tell my care team before I take this medication? ?They need to know if you have any of these conditions: ?History of blood diseases, like leukemia ?History of  irregular heartbeat ?Kidney disease ?Liver disease ?Myasthenia gravis ?An unusual or allergic reaction to azithromycin, erythromycin, other macrolide antibiotics, foods, dyes, or preservatives ?Pregnant or trying to get pregnant ?Breast-feeding ?How should I use this medication? ?Take this medication by mouth with a full glass of water. Follow the directions on the prescription label. The tablets can be taken with food or on an empty stomach. If the medication upsets your stomach, take it with food. Take your medication at regular intervals. Do not take your medication more often than directed. Take all of your medication as directed even if you think you are better. Do not skip doses or stop your medication early. ?Talk to your care team regarding the use of this medication in children. While this medication may be prescribed for children as young as 6 months for selected conditions, precautions do apply. ?Overdosage: If you think you have taken too much of this medicine contact a poison control center or emergency room at once. ?NOTE: This medicine is only for you. Do not share this medicine with others. ?What if I miss a dose? ?If you miss a dose, take it as soon as you can. If it is almost time for your next dose, take only that dose. Do not take double or extra doses. ?What may interact with this medication? ?Do not take this medication with any of the following: ?Cisapride ?Dronedarone ?Pimozide ?Thioridazine ?This medication may also interact with the following: ?Antacids that contain aluminum or  magnesium ?Birth control pills ?Colchicine ?Cyclosporine ?Digoxin ?Ergot alkaloids like dihydroergotamine, ergotamine ?Nelfinavir ?Other medications that prolong the QT interval (an abnormal heart rhythm) ?Phenytoin ?Warfarin ?This list may not describe all possible interactions. Give your health care provider a list of all the medicines, herbs, non-prescription drugs, or dietary supplements you use. Also tell them  if you smoke, drink alcohol, or use illegal drugs. Some items may interact with your medicine. ?What should I watch for while using this medication? ?Tell your care team if your symptoms do not start to g

## 2021-09-01 LAB — COMPREHENSIVE METABOLIC PANEL
ALT: 30 U/L (ref 0–35)
AST: 30 U/L (ref 0–37)
Albumin: 4.5 g/dL (ref 3.5–5.2)
Alkaline Phosphatase: 78 U/L (ref 39–117)
BUN: 18 mg/dL (ref 6–23)
CO2: 18 mEq/L — ABNORMAL LOW (ref 19–32)
Calcium: 9.4 mg/dL (ref 8.4–10.5)
Chloride: 103 mEq/L (ref 96–112)
Creatinine, Ser: 0.95 mg/dL (ref 0.40–1.20)
GFR: 69.1 mL/min (ref >60.00)
Glucose, Bld: 84 mg/dL (ref 70–99)
Potassium: 3.8 mEq/L (ref 3.5–5.1)
Sodium: 137 mEq/L (ref 135–145)
Total Bilirubin: 0.4 mg/dL (ref 0.2–1.2)
Total Protein: 7.7 g/dL (ref 6.0–8.3)

## 2021-09-01 LAB — CBC WITH DIFFERENTIAL/PLATELET
Basophils Absolute: 0.1 10*3/uL (ref 0.0–0.1)
Basophils Relative: 1 % (ref 0.0–3.0)
Eosinophils Absolute: 0.1 10*3/uL (ref 0.0–0.7)
Eosinophils Relative: 1.3 % (ref 0.0–5.0)
HCT: 43.3 % (ref 36.0–46.0)
Hemoglobin: 14.5 g/dL (ref 12.0–15.0)
Lymphocytes Relative: 29.8 % (ref 12.0–46.0)
Lymphs Abs: 2.6 10*3/uL (ref 0.7–4.0)
MCHC: 33.5 g/dL (ref 30.0–36.0)
MCV: 89.3 fl (ref 78.0–100.0)
Monocytes Absolute: 0.7 10*3/uL (ref 0.1–1.0)
Monocytes Relative: 8 % (ref 3.0–12.0)
Neutro Abs: 5.2 10*3/uL (ref 1.4–7.7)
Neutrophils Relative %: 59.9 % (ref 43.0–77.0)
Platelets: 272 10*3/uL (ref 150.0–400.0)
RBC: 4.85 Mil/uL (ref 3.87–5.11)
RDW: 12.9 % (ref 11.5–15.5)
WBC: 8.7 10*3/uL (ref 4.0–10.5)

## 2021-09-05 ENCOUNTER — Encounter: Payer: Self-pay | Admitting: Adult Health

## 2021-09-05 ENCOUNTER — Telehealth: Payer: Self-pay

## 2021-09-05 NOTE — Progress Notes (Signed)
CMP and CBC ok.

## 2021-09-05 NOTE — Telephone Encounter (Signed)
Results msg sent ?

## 2021-09-25 ENCOUNTER — Ambulatory Visit: Payer: 59 | Admitting: Adult Health

## 2021-10-12 ENCOUNTER — Encounter: Payer: Self-pay | Admitting: Family

## 2021-10-12 ENCOUNTER — Ambulatory Visit (INDEPENDENT_AMBULATORY_CARE_PROVIDER_SITE_OTHER): Payer: 59 | Admitting: Family

## 2021-10-12 VITALS — BP 130/88 | HR 86 | Temp 98.3°F | Resp 16 | Ht 62.0 in | Wt 169.6 lb

## 2021-10-12 DIAGNOSIS — R7303 Prediabetes: Secondary | ICD-10-CM

## 2021-10-12 DIAGNOSIS — F419 Anxiety disorder, unspecified: Secondary | ICD-10-CM | POA: Diagnosis not present

## 2021-10-12 DIAGNOSIS — E6609 Other obesity due to excess calories: Secondary | ICD-10-CM | POA: Diagnosis not present

## 2021-10-12 DIAGNOSIS — Z6831 Body mass index (BMI) 31.0-31.9, adult: Secondary | ICD-10-CM | POA: Diagnosis not present

## 2021-10-12 DIAGNOSIS — F32A Depression, unspecified: Secondary | ICD-10-CM

## 2021-10-12 DIAGNOSIS — E7800 Pure hypercholesterolemia, unspecified: Secondary | ICD-10-CM

## 2021-10-12 NOTE — Progress Notes (Signed)
? ?Established Patient Office Visit ? ?Subjective:  ?Patient ID: Regina Salazar, female    DOB: [birth year]  Age: 51 y.o. MRN: [id number] ? ?CC:  ?Chief Complaint  ?Patient presents with  ? Establish Care  ? ? ?HPI ?Regina Salazar is here for a transition of care visit. ? ?Prior provider was: Laverna Peace, FNP  ?Pt is without acute concerns.  ? ?Colonscopy: 05/08/2019 repeat in five years. Had one polyp benign ? ?Currently menopausal, last period was two years ago.  ? ?Pap: 2013, pt states unable to tolerate mentally, she states it makes her very uncomfortable and she will not stay on the table (will wiggle off) trying to get away from the speculum. She states she will try to work on it in the future but for now she is unable to even consider. Denies known traumatic history. ? ?chronic concerns: ? ?Hyperlipidemia: recently increased in the last six months to pravastatin 40 mg once dialy.  ? ?Anxiety/depression: doing well. Currently on wellbutrin 300 mg, recent increase from 150 mg back in October and also taking buspar 15 mg bid. She is tolerating well and states her anxiety is much better controlled no si/ hi  ? ? ?  08/31/2021  ?  3:55 PM 06/26/2021  ? 11:38 AM 07/21/2020  ? 10:51 AM 04/07/2019  ?  9:33 AM  ?GAD 7 : Generalized Anxiety Score  ?Nervous, Anxious, on Edge 0 1 1 0  ?Control/stop worrying 0 1 1 0  ?Worry too much - different things 0 1 1 0  ?Trouble relaxing 0 0 1 0  ?Restless 0 1 1 0  ?Easily annoyed or irritable 0 1 1 0  ?Afraid - awful might happen 0 1 1 0  ?Total GAD 7 Score 0 6 7 0  ?Anxiety Difficulty Not difficult at all Not difficult at all Somewhat difficult Not difficult at all  ? ? ? ?  08/31/2021  ?  3:55 PM 06/26/2021  ? 11:38 AM 06/26/2021  ? 11:13 AM  ?PHQ9 SCORE ONLY  ?PHQ-9 Total Score 0 5 0  ? ? ? ?Past Medical History:  ?Diagnosis Date  ? Anxiety   ? Depression   ? ? ?Past Surgical History:  ?Procedure Laterality Date  ? COLONOSCOPY WITH PROPOFOL N/A 05/08/2019  ? Procedure:  COLONOSCOPY WITH PROPOFOL;  Surgeon: Virgel Manifold, MD;  Location: [facility];  Service: Endoscopy;  Laterality: N/A;  ? cyst wrist    ? age 22  ? EXAMINATION UNDER ANESTHESIA  06/05/2012  ? Procedure: EXAM UNDER ANESTHESIA;  Surgeon: Donnamae Jude, MD;  Location: [facility];  Service: Gynecology;  Laterality: N/A;  pap testing  ? ? ?Family History  ?Problem Relation Age of Onset  ? Breast cancer Mother   ? Cancer Mother 69  ?     breast  ? Cancer Father   ?     colon  ? Diabetes Father   ? ? ?Social History  ? ?Socioeconomic History  ? Marital status: Married  ?  Spouse name: Not on file  ? Number of children: 3  ? Years of education: Not on file  ? Highest education level: Not on file  ?Occupational History  ?  Employer: [employer]  ?Tobacco Use  ? Smoking status: Never  ? Smokeless tobacco: Never  ?Vaping Use  ? Vaping Use: Never used  ?Substance and Sexual Activity  ? Alcohol use: No  ? Drug use: No  ? Sexual activity: Yes  ?  Partners: Male  ?  Birth control/protection: Post-menopausal  ?Other Topics Concern  ? Not on file  ?Social History Narrative  ? Moved here from [place] last year for her husband's job.  ? 3 adopted children ages 67, 6, 77.  ?   ? Works at a daycare.  ? ?Social Determinants of Health  ? ?Financial Resource Strain: Not on file  ?Food Insecurity: Not on file  ?Transportation Needs: Not on file  ?Physical Activity: Not on file  ?Stress: Not on file  ?Social Connections: Not on file  ?Intimate Partner Violence: Not on file  ? ? ?Outpatient Medications Prior to Visit  ?Medication Sig Dispense Refill  ? ALPRAZolam (XANAX) 0.5 MG tablet Take 1 tablet (0.5 mg total) by mouth daily as needed for anxiety. 1/2 to 1 tablet up to three times as needed for panic attacks and insomnia 30 tablet 0  ? buPROPion (WELLBUTRIN XL) 300 MG 24 hr tablet Take 1 tablet (300 mg total) by mouth daily. 90 tablet 1  ? busPIRone (BUSPAR) 15 MG tablet Take 1 tablet (15 mg total) by mouth 2 (two) times  daily. 270 tablet 1  ? pravastatin (PRAVACHOL) 40 MG tablet Take 1 tablet (40 mg total) by mouth daily. 90 tablet 0  ? albuterol (VENTOLIN HFA) 108 (90 Base) MCG/ACT inhaler Inhale 1-2 puffs into the lungs every 6 (six) hours as needed for wheezing. 8 g 0  ? ?No facility-administered medications prior to visit.  ? ? ?Allergies  ?Allergen Reactions  ? Prednisone Anxiety  ?  Induces her anxiety  ? ? ?ROS ?Review of Systems ? ?Review of Systems  ?Respiratory:  Negative for shortness of breath.   ?Cardiovascular:  Negative for chest pain and palpitations.  ?Gastrointestinal:  Negative for constipation and diarrhea.  ?Genitourinary:  Negative for dysuria, frequency and urgency.  ?Musculoskeletal:  Negative for myalgias.  ?Psychiatric/Behavioral:  Negative for depression and suicidal ideas.   ?All other systems reviewed and are negative. ? ?  ?Objective:  ?  ?Physical Exam ? ?Gen: NAD, resting comfortably ?CV: RRR with no murmurs appreciated ?Pulm: NWOB, CTAB with no crackles, wheezes, or rhonchi ?Skin: warm, dry ?Psych: Normal affect and thought content ? ?BP 130/88   Pulse 86   Temp 98.3 ?F (36.8 ?C)   Resp 16   Ht '5\' 2"'$  (1.575 m)   Wt 169 lb 9 oz (76.9 kg)   SpO2 98%   BMI 31.01 kg/m?  ?Wt Readings from Last 3 Encounters:  ?10/12/21 169 lb 9 oz (76.9 kg)  ?08/31/21 171 lb 6.4 oz (77.7 kg)  ?06/26/21 174 lb 9.6 oz (79.2 kg)  ? ? ? ?Health Maintenance Due  ?Topic Date Due  ? Zoster Vaccines- Shingrix (1 of 2) Never done  ? COVID-19 Vaccine (3 - Moderna risk series) 10/24/2019  ? ? ?There are no preventive care reminders to display for this patient. ? ?Lab Results  ?Component Value Date  ? TSH 2.91 04/04/2021  ? ?Lab Results  ?Component Value Date  ? WBC 8.7 08/31/2021  ? HGB 14.5 08/31/2021  ? HCT 43.3 08/31/2021  ? MCV 89.3 08/31/2021  ? PLT 272.0 08/31/2021  ? ?Lab Results  ?Component Value Date  ? NA 137 08/31/2021  ? K 3.8 08/31/2021  ? CO2 18 (L) 08/31/2021  ? GLUCOSE 84 08/31/2021  ? BUN 18 08/31/2021  ?  CREATININE 0.95 08/31/2021  ? BILITOT 0.4 08/31/2021  ? ALKPHOS 78 08/31/2021  ? AST 30 08/31/2021  ? ALT 30 08/31/2021  ?  PROT 7.7 08/31/2021  ? ALBUMIN 4.5 08/31/2021  ? CALCIUM 9.4 08/31/2021  ? GFR 69.10 08/31/2021  ? ?Lab Results  ?Component Value Date  ? CHOL 191 10/18/2021  ? ?Lab Results  ?Component Value Date  ? HDL 51.70 10/18/2021  ? ?Lab Results  ?Component Value Date  ? LDLCALC 118 (H) 10/18/2021  ? ?Lab Results  ?Component Value Date  ? TRIG 110.0 10/18/2021  ? ?Lab Results  ?Component Value Date  ? CHOLHDL 4 10/18/2021  ? ?Lab Results  ?Component Value Date  ? HGBA1C 6.2 10/18/2021  ? ? ?  ?Assessment & Plan:  ? ?Problem List Items Addressed This Visit   ? ?  ? Other  ? Anxiety and depression - Primary  ?  Continue xanax prn  ?Discussed anxiety reducing techniques, recommend therapy or CBT for mental wall in regards to pap, possible hypnosis to consider in future. Pt states she will think about this.  ?Continue buproprion 300 mg and buspar 15 mg twice daily ? ?  ?  ? Pure hypercholesterolemia  ?  Ordered lipid panel, pending results. Work on low cholesterol diet and exercise as tolerated ?Continue pravastatin 40 mg  ? ? ?  ?  ? Relevant Orders  ? Lipid panel (Completed)  ? Prediabetes  ?  a1c ordered pending results ?Work on diabetic diet and exercise ? ?  ?  ? Relevant Orders  ? Hemoglobin A1c (Completed)  ? Class 1 obesity due to excess calories without serious comorbidity with body mass index (BMI) of 31.0 to 31.9 in adult  ?  Work on diet and exercise. ? ?  ?  ? RESOLVED: Body mass index (BMI) of 31.0-31.9 in adult  ?  Work on diet and exercise ? ?  ?  ? ? ?No orders of the defined types were placed in this encounter. ? ? ?Follow-up: Return in about 6 months (around 04/13/2022) for annual physical in six months with fasting labs / come fasting to visit.  ? ? ?Eugenia Pancoast, FNP ?

## 2021-10-12 NOTE — Patient Instructions (Signed)
?  I have created an order for lab work today during our visit.  ?Please schedule an appointment on your way out to return to the lab at your convenience. Please return fasting at your lab appointment (meaning you can only drink black coffee and or water prior to your appointment). I will reach out to you in regards to the labs when I receive the results.  ? ? ?

## 2021-10-18 ENCOUNTER — Other Ambulatory Visit (INDEPENDENT_AMBULATORY_CARE_PROVIDER_SITE_OTHER): Payer: 59

## 2021-10-18 DIAGNOSIS — E7800 Pure hypercholesterolemia, unspecified: Secondary | ICD-10-CM

## 2021-10-18 DIAGNOSIS — R7303 Prediabetes: Secondary | ICD-10-CM

## 2021-10-18 LAB — LIPID PANEL
Cholesterol: 191 mg/dL (ref 0–200)
HDL: 51.7 mg/dL
LDL Cholesterol: 118 mg/dL — ABNORMAL HIGH (ref 0–99)
NonHDL: 139.68
Total CHOL/HDL Ratio: 4
Triglycerides: 110 mg/dL (ref 0.0–149.0)
VLDL: 22 mg/dL (ref 0.0–40.0)

## 2021-10-18 LAB — HEMOGLOBIN A1C: Hgb A1c MFr Bld: 6.2 % (ref 4.6–6.5)

## 2021-10-20 NOTE — Assessment & Plan Note (Addendum)
Ordered lipid panel, pending results. Work on low cholesterol diet and exercise as tolerated ?Continue pravastatin 40 mg  ? ?

## 2021-10-20 NOTE — Assessment & Plan Note (Signed)
Work on diet and exercise 

## 2021-10-20 NOTE — Assessment & Plan Note (Signed)
Continue xanax prn  ?Discussed anxiety reducing techniques, recommend therapy or CBT for mental wall in regards to pap, possible hypnosis to consider in future. Pt states she will think about this.  ?Continue buproprion 300 mg and buspar 15 mg twice daily ?

## 2021-10-20 NOTE — Assessment & Plan Note (Signed)
a1c ordered pending results ?Work on diabetic diet and exercise ?

## 2021-11-14 ENCOUNTER — Other Ambulatory Visit: Payer: Self-pay

## 2021-11-14 DIAGNOSIS — E7800 Pure hypercholesterolemia, unspecified: Secondary | ICD-10-CM

## 2021-11-14 MED ORDER — PRAVASTATIN SODIUM 40 MG PO TABS: 40.0000 mg | ORAL_TABLET | Freq: Every day | ORAL | 0 refills | Status: DC

## 2021-11-17 ENCOUNTER — Other Ambulatory Visit: Payer: Self-pay | Admitting: Family

## 2021-11-17 ENCOUNTER — Encounter: Payer: Self-pay | Admitting: Family

## 2021-11-17 DIAGNOSIS — F419 Anxiety disorder, unspecified: Secondary | ICD-10-CM

## 2021-11-17 MED ORDER — BUPROPION HCL ER (XL) 300 MG PO TB24: 300.0000 mg | ORAL_TABLET | Freq: Every day | ORAL | 1 refills | Status: DC

## 2022-01-08 ENCOUNTER — Encounter: Payer: Self-pay | Admitting: Family

## 2022-01-16 ENCOUNTER — Encounter: Payer: Self-pay | Admitting: Family

## 2022-01-18 ENCOUNTER — Encounter: Payer: Self-pay | Admitting: Nurse Practitioner

## 2022-01-18 ENCOUNTER — Ambulatory Visit (INDEPENDENT_AMBULATORY_CARE_PROVIDER_SITE_OTHER): Payer: 59 | Admitting: Nurse Practitioner

## 2022-01-18 VITALS — BP 148/92 | HR 83 | Temp 97.2°F | Resp 12 | Wt 171.0 lb

## 2022-01-18 DIAGNOSIS — H109 Unspecified conjunctivitis: Secondary | ICD-10-CM | POA: Diagnosis not present

## 2022-01-18 MED ORDER — ERYTHROMYCIN 5 MG/GM OP OINT: 1.0000 | TOPICAL_OINTMENT | Freq: Three times a day (TID) | OPHTHALMIC | 0 refills | Status: AC

## 2022-01-18 NOTE — Patient Instructions (Signed)
Nice to see you today I sent in the eye ointment.  Use approx 1 cm strip three times a day If no improvement follow up or let me know

## 2022-01-18 NOTE — Progress Notes (Signed)
Acute Office Visit  Subjective:     Patient ID: Regina Salazar, female    DOB: 05-08-1971, 51 y.o.   MRN: [id number]  Chief Complaint  Patient presents with   follow up on pink eye    Sx started on 01/10/22 and patient went to Urgent Care and was give eye drops, not much improvement since using these. Still has stinging and irritation sensation present in both eyes , wakes up with matted eyes.     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 States that she has been using the drops since that day. Yesterday was the last dose. States that she has not improved with the use of the drops and stated they started burning when she was using them towards the end. She does wear glasses but does not wear contacts  She does endorse scratchiness, itching, watering, and eye matting in the morning.  Started in the right eye and went to the left. At first her eyes will mat.  Review of Systems  Constitutional:  Negative for chills and fever.  HENT:  Negative for congestion, ear discharge, ear pain, sinus pain and sore throat.   Eyes:  Positive for discharge. Negative for blurred vision, double vision and pain.  Respiratory:  Negative for cough and shortness of breath.   Cardiovascular:  Negative for chest pain.        Objective:    BP (!) 148/92   Pulse 83   Temp (!) 97.2 F (36.2 C)   Resp 12   Wt 171 lb (77.6 kg)   SpO2 96%   BMI 31.28 kg/m    Physical Exam Vitals and nursing note reviewed.  Constitutional:      Appearance: Normal appearance.  HENT:     Right Ear: Ear canal and external ear normal.     Left Ear: Ear canal and external ear normal.     Mouth/Throat:     Mouth: Mucous membranes are moist.     Pharynx: Oropharynx is clear.  Eyes:     Extraocular Movements: Extraocular movements intact.      Conjunctiva/sclera: Conjunctivae normal.     Pupils: Pupils are equal, round, and reactive to light.  Cardiovascular:     Rate and Rhythm: Normal rate and regular rhythm.     Heart sounds: Normal heart sounds.  Pulmonary:     Effort: Pulmonary effort is normal.     Breath sounds: Normal breath sounds.  Neurological:     Mental Status: She is alert.     No results found for any visits on 01/18/22.      Assessment & Plan:   Problem List Items Addressed This Visit   None Visit Diagnoses     Bacterial conjunctivitis    -  Primary   Relevant Medications   erythromycin ophthalmic ointment       Meds ordered this encounter  Medications   erythromycin ophthalmic ointment    Sig: Place 1 Application into the left eye 3 (three) times daily for 7 days. In the right eye    Dispense:  1 g    Refill:  0    Order Specific Question:   Supervising Provider    Answer:   TOWER, MARNE A [[id number]]    No follow-ups on file.  Romilda Garret, NP

## 2022-01-18 NOTE — Assessment & Plan Note (Signed)
Exam benign in office.  Patient does wear corrective lenses but no contacts per patient report.  She was treated with Polytrim without improvement.  We will switch patient's treatment from Polytrim to erythromycin ointment.  3 times daily for a week.  Did inform patient that she is likely to have blurred vision after instilling the ophthalmic ointment.  Follow-up if no improvement

## 2022-03-16 ENCOUNTER — Other Ambulatory Visit: Payer: Self-pay | Admitting: Family

## 2022-03-16 DIAGNOSIS — E7800 Pure hypercholesterolemia, unspecified: Secondary | ICD-10-CM

## 2022-04-09 ENCOUNTER — Other Ambulatory Visit: Payer: Self-pay | Admitting: Family

## 2022-04-09 DIAGNOSIS — Z1231 Encounter for screening mammogram for malignant neoplasm of breast: Secondary | ICD-10-CM

## 2022-04-12 ENCOUNTER — Other Ambulatory Visit: Payer: Self-pay | Admitting: Family

## 2022-04-12 DIAGNOSIS — F419 Anxiety disorder, unspecified: Secondary | ICD-10-CM

## 2022-04-13 ENCOUNTER — Encounter: Payer: 59 | Admitting: Family

## 2022-05-01 ENCOUNTER — Ambulatory Visit (INDEPENDENT_AMBULATORY_CARE_PROVIDER_SITE_OTHER): Payer: 59 | Admitting: Family

## 2022-05-01 ENCOUNTER — Encounter: Payer: Self-pay | Admitting: Family

## 2022-05-01 VITALS — BP 125/78 | HR 64 | Temp 97.9°F | Resp 16 | Ht 62.0 in | Wt 160.4 lb

## 2022-05-01 DIAGNOSIS — Z6831 Body mass index (BMI) 31.0-31.9, adult: Secondary | ICD-10-CM

## 2022-05-01 DIAGNOSIS — Z79899 Other long term (current) drug therapy: Secondary | ICD-10-CM | POA: Diagnosis not present

## 2022-05-01 DIAGNOSIS — E7800 Pure hypercholesterolemia, unspecified: Secondary | ICD-10-CM

## 2022-05-01 DIAGNOSIS — Z23 Encounter for immunization: Secondary | ICD-10-CM | POA: Diagnosis not present

## 2022-05-01 DIAGNOSIS — R7303 Prediabetes: Secondary | ICD-10-CM

## 2022-05-01 DIAGNOSIS — Z1159 Encounter for screening for other viral diseases: Secondary | ICD-10-CM

## 2022-05-01 DIAGNOSIS — Z114 Encounter for screening for human immunodeficiency virus [HIV]: Secondary | ICD-10-CM

## 2022-05-01 DIAGNOSIS — F419 Anxiety disorder, unspecified: Secondary | ICD-10-CM

## 2022-05-01 DIAGNOSIS — Z0000 Encounter for general adult medical examination without abnormal findings: Secondary | ICD-10-CM | POA: Diagnosis not present

## 2022-05-01 DIAGNOSIS — E6609 Other obesity due to excess calories: Secondary | ICD-10-CM

## 2022-05-01 DIAGNOSIS — F32A Depression, unspecified: Secondary | ICD-10-CM

## 2022-05-01 LAB — COMPREHENSIVE METABOLIC PANEL
ALT: 27 U/L (ref 0–35)
AST: 29 U/L (ref 0–37)
Albumin: 4.5 g/dL (ref 3.5–5.2)
Alkaline Phosphatase: 69 U/L (ref 39–117)
BUN: 15 mg/dL (ref 6–23)
CO2: 26 mEq/L (ref 19–32)
Calcium: 9 mg/dL (ref 8.4–10.5)
Chloride: 105 mEq/L (ref 96–112)
Creatinine, Ser: 0.84 mg/dL (ref 0.40–1.20)
GFR: 79.73 mL/min (ref >60.00)
Glucose, Bld: 92 mg/dL (ref 70–99)
Potassium: 3.9 mEq/L (ref 3.5–5.1)
Sodium: 138 mEq/L (ref 135–145)
Total Bilirubin: 0.6 mg/dL (ref 0.2–1.2)
Total Protein: 7.2 g/dL (ref 6.0–8.3)

## 2022-05-01 LAB — LIPID PANEL
Cholesterol: 197 mg/dL (ref 0–200)
HDL: 68.1 mg/dL (ref >39.00)
LDL Cholesterol: 113 mg/dL — ABNORMAL HIGH (ref 0–99)
NonHDL: 129.39
Total CHOL/HDL Ratio: 3
Triglycerides: 83 mg/dL (ref 0.0–149.0)
VLDL: 16.6 mg/dL (ref 0.0–40.0)

## 2022-05-01 LAB — HEMOGLOBIN A1C: Hgb A1c MFr Bld: 6.1 % (ref 4.6–6.5)

## 2022-05-01 MED ORDER — BUPROPION HCL ER (XL) 300 MG PO TB24: 300.0000 mg | ORAL_TABLET | Freq: Every day | ORAL | 3 refills | Status: DC

## 2022-05-01 MED ORDER — BUSPIRONE HCL 15 MG PO TABS: 15.0000 mg | ORAL_TABLET | Freq: Two times a day (BID) | ORAL | 3 refills | Status: DC

## 2022-05-01 MED ORDER — PRAVASTATIN SODIUM 40 MG PO TABS: 40.0000 mg | ORAL_TABLET | Freq: Every day | ORAL | 3 refills | Status: DC

## 2022-05-01 NOTE — Progress Notes (Signed)
Established Patient Office Visit  Subjective:  Patient ID: Regina Salazar, female    DOB: [birth year]  Age: 51 y.o. MRN: [id number]  CC:  Chief Complaint  Patient presents with   Annual Exam    HPI Regina Salazar is here for an annual physical.   Flu vaccine: declines  Tdap 2013, due for this, pt will get today.  Covid vaccine: declines for onw.  Shingrix vaccination: has not received, will consider.  Mammogram: scheduled already 05/2022.  Pap: can no tolerate anymore, she doesn't have sex.denies any traumatic past. Has tried to take with xanax prior as well without good luck.  Colonoscopy: due again in 2025.    Dental screenings, up to date, has appt today.  Eye exam, overdue. Will make appt.wears glasses. Does not wear contacts.   chronic concerns:  HLD: pravastatin 40 mg once daily, tolerating well.  Lab Results  Component Value Date   CHOL 191 10/18/2021   HDL 51.70 10/18/2021   LDLCALC 118 (H) 10/18/2021   LDLDIRECT 147.3 04/29/2012   TRIG 110.0 10/18/2021   CHOLHDL 4 10/18/2021   Post menopause: stopped about three years ago.   Anxiety depression: managed well buspar 15 mg twice daily, and bupropion 300 mg once daily. Does not see a therapist.   Prediabetes: tries to watch her diet, however admits not the best at it. She is working out with her husband 4-5 times a week, however got out of habit, and husband and her trying to get back into this.  Lab Results  Component Value Date   HGBA1C 6.2 10/18/2021     Past Medical History:  Diagnosis Date   Anxiety    Depression     Past Surgical History:  Procedure Laterality Date   COLONOSCOPY WITH PROPOFOL N/A 05/08/2019   Procedure: COLONOSCOPY WITH PROPOFOL;  Surgeon: Virgel Manifold, MD;  Location: [facility];  Service: Endoscopy;  Laterality: N/A;   cyst wrist     age 51   EXAMINATION UNDER ANESTHESIA  06/05/2012   Procedure: EXAM UNDER ANESTHESIA;  Surgeon: Donnamae Jude, MD;  Location:  [facility];  Service: Gynecology;  Laterality: N/A;  pap testing    Family History  Problem Relation Age of Onset   Breast cancer Mother 79   Cancer - Colon Father    Diabetes Father    Colon cancer Paternal Grandfather    Breast cancer Other     Social History   Socioeconomic History   Marital status: Married    Spouse name: Not on file   Number of children: 3   Years of education: Not on file   Highest education level: Not on file  Occupational History    Employer: [employer]  Tobacco Use   Smoking status: Never   Smokeless tobacco: Never  Vaping Use   Vaping Use: Never used  Substance and Sexual Activity   Alcohol use: No   Drug use: No   Sexual activity: Yes    Partners: Male    Birth control/protection: Post-menopausal  Other Topics Concern   Not on file  Social History Narrative   Moved here from [place] last year for her husband's job.   3 adopted children ages 20, 47, 67.      Works at a daycare.   Social Determinants of Health   Financial Resource Strain: Not on file  Food Insecurity: Not on file  Transportation Needs: Not on file  Physical Activity: Not on file  Stress:  Not on file  Social Connections: Not on file  Intimate Partner Violence: Not on file    Outpatient Medications Prior to Visit  Medication Sig Dispense Refill   ALPRAZolam (XANAX) 0.5 MG tablet Take 1 tablet (0.5 mg total) by mouth daily as needed for anxiety. 1/2 to 1 tablet up to three times as needed for panic attacks and insomnia 30 tablet 0   buPROPion (WELLBUTRIN XL) 300 MG 24 hr tablet TAKE 1 TABLET BY MOUTH DAILY 90 tablet 3   busPIRone (BUSPAR) 15 MG tablet Take 1 tablet (15 mg total) by mouth 2 (two) times daily. 270 tablet 1   pravastatin (PRAVACHOL) 40 MG tablet TAKE 1 TABLET BY MOUTH DAILY 90 tablet 3   trimethoprim-polymyxin b (POLYTRIM) ophthalmic solution Place 1 drop into the right eye every 4 (four) hours. (Patient not taking: Reported on 01/18/2022)     No  facility-administered medications prior to visit.    Allergies  Allergen Reactions   Prednisone Anxiety    Induces her anxiety    ROS Review of Systems  Review of Systems  Respiratory:  Negative for shortness of breath.   Cardiovascular:  Negative for chest pain and palpitations.  Gastrointestinal:  Negative for constipation and diarrhea.  Genitourinary:  Negative for dysuria, frequency and urgency.  Musculoskeletal:  Negative for myalgias.  Psychiatric/Behavioral:  Negative for depression and suicidal ideas.   All other systems reviewed and are negative.    Objective:    Physical Exam  Gen: NAD, resting comfortably CV: RRR with no murmurs appreciated Pulm: NWOB, CTAB with no crackles, wheezes, or rhonchi Skin: warm, dry Psych: Normal affect and thought content  BP 125/78   Pulse 64   Temp 97.9 F (36.6 C)   Resp 16   Ht '5\' 2"'$  (1.575 m)   Wt 160 lb 6 oz (72.7 kg)   SpO2 99%   BMI 29.33 kg/m  Wt Readings from Last 3 Encounters:  05/01/22 160 lb 6 oz (72.7 kg)  01/18/22 171 lb (77.6 kg)  10/12/21 169 lb 9 oz (76.9 kg)     There are no preventive care reminders to display for this patient.   There are no preventive care reminders to display for this patient.  Lab Results  Component Value Date   TSH 2.91 04/04/2021   Lab Results  Component Value Date   WBC 8.7 08/31/2021   HGB 14.5 08/31/2021   HCT 43.3 08/31/2021   MCV 89.3 08/31/2021   PLT 272.0 08/31/2021   Lab Results  Component Value Date   NA 137 08/31/2021   K 3.8 08/31/2021   CO2 18 (L) 08/31/2021   GLUCOSE 84 08/31/2021   BUN 18 08/31/2021   CREATININE 0.95 08/31/2021   BILITOT 0.4 08/31/2021   ALKPHOS 78 08/31/2021   AST 30 08/31/2021   ALT 30 08/31/2021   PROT 7.7 08/31/2021   ALBUMIN 4.5 08/31/2021   CALCIUM 9.4 08/31/2021   GFR 69.10 08/31/2021   Lab Results  Component Value Date   CHOL 191 10/18/2021   Lab Results  Component Value Date   HDL 51.70 10/18/2021   Lab  Results  Component Value Date   LDLCALC 118 (H) 10/18/2021   Lab Results  Component Value Date   TRIG 110.0 10/18/2021   Lab Results  Component Value Date   CHOLHDL 4 10/18/2021   Lab Results  Component Value Date   HGBA1C 6.2 10/18/2021      Assessment & Plan:   Problem List  Items Addressed This Visit       Other   Anxiety and depression    Continue meds as prescribed.      Pure hypercholesterolemia    Ordered lipid panel, pending results. Work on low cholesterol diet and exercise as tolerated Continue pravastatin 40 mg once daily       Relevant Orders   Lipid panel   Prediabetes    Pt advised of the following: Work on a diabetic diet, try to incorporate exercise at least 20-30 a day for 3 days a week or more.  Ordering A1c pending results.      Relevant Orders   Hemoglobin A1c   Class 1 obesity due to excess calories without serious comorbidity with body mass index (BMI) of 31.0 to 31.9 in adult    Work on diet and exercise as tolerated        Relevant Orders   Hemoglobin A1c   On statin therapy   Relevant Orders   Comprehensive metabolic panel   Encounter for general adult medical examination without abnormal findings - Primary    Patient Counseling(The following topics were reviewed):  Preventative care handout given to pt  Health maintenance and immunizations reviewed. Please refer to Health maintenance section. Pt advised on safe sex, wearing seatbelts in car, and proper nutrition labwork ordered today for annual Dental health: Discussed importance of regular tooth brushing, flossing, and dental visits.  Pt declines pap  Ordered hiv and hep c for quality measure screening Pt to receive tetanus and shingrix today.  Rtc three months for shingrx 2nd dose.        Other Visit Diagnoses     Encounter for hepatitis C screening test for low risk patient       Relevant Orders   Hepatitis C Antibody   Screening for HIV (human immunodeficiency  virus)       Relevant Orders   HIV antibody (with reflex)   Need for shingles vaccine       Relevant Orders   Varicella-zoster vaccine IM (Completed)   Need for Td vaccine       Relevant Orders   Td vaccine greater than or equal to 52yo preservative free IM (Completed)       No orders of the defined types were placed in this encounter.   Follow-up: Return in about 6 months (around 10/30/2022) for f/u cholesterol.    Eugenia Pancoast, FNP

## 2022-05-01 NOTE — Assessment & Plan Note (Signed)
Ordered lipid panel, pending results. Work on low cholesterol diet and exercise as tolerated Continue pravastatin 40 mg once daily

## 2022-05-01 NOTE — Assessment & Plan Note (Signed)
Pt advised of the following: Work on a diabetic diet, try to incorporate exercise at least 20-30 a day for 3 days a week or more.  Ordering A1c pending results.

## 2022-05-01 NOTE — Assessment & Plan Note (Signed)
Continue meds as prescribed.

## 2022-05-01 NOTE — Assessment & Plan Note (Signed)
Work on diet and exercise as tolerated  ?

## 2022-05-01 NOTE — Assessment & Plan Note (Addendum)
Patient Counseling(The following topics were reviewed):  Preventative care handout given to pt  Health maintenance and immunizations reviewed. Please refer to Health maintenance section. Pt advised on safe sex, wearing seatbelts in car, and proper nutrition labwork ordered today for annual Dental health: Discussed importance of regular tooth brushing, flossing, and dental visits.  Pt declines pap  Ordered hiv and hep c for quality measure screening Pt to receive tetanus and shingrix today.  Rtc three months for shingrx 2nd dose.

## 2022-05-02 LAB — HIV ANTIBODY (ROUTINE TESTING W REFLEX): HIV 1&2 Ab, 4th Generation: NONREACTIVE

## 2022-05-02 LAB — HEPATITIS C ANTIBODY: Hepatitis C Ab: NONREACTIVE

## 2022-05-02 MED ORDER — BUPROPION HCL ER (XL) 300 MG PO TB24: 300.0000 mg | ORAL_TABLET | Freq: Every day | ORAL | 3 refills | Status: DC

## 2022-05-02 MED ORDER — BUSPIRONE HCL 15 MG PO TABS: 15.0000 mg | ORAL_TABLET | Freq: Two times a day (BID) | ORAL | 3 refills | Status: DC

## 2022-05-02 MED ORDER — PRAVASTATIN SODIUM 40 MG PO TABS: 40.0000 mg | ORAL_TABLET | Freq: Every day | ORAL | 3 refills | Status: DC

## 2022-05-02 NOTE — Addendum Note (Signed)
Addended by: Tiffany Kocher V on: 05/02/2022 12:12 PM   Modules accepted: Orders

## 2022-05-02 NOTE — Addendum Note (Signed)
Addended by: Eugenia Pancoast on: 05/02/2022 12:14 PM   Modules accepted: Orders

## 2022-05-12 ENCOUNTER — Encounter: Payer: Self-pay | Admitting: Family

## 2022-05-14 NOTE — Telephone Encounter (Signed)
Left message to return call to our office.  

## 2022-05-16 NOTE — Telephone Encounter (Signed)
Left message to return call to our office.  

## 2022-05-26 IMAGING — DX DG CHEST 2V
2 series · 2 of 2 positions shown · non-contrast
Comparison: None.

CLINICAL DATA: 51-year-old female with a history of cough for 4
weeks

EXAM:
CHEST - 2 VIEW

[chest pa]
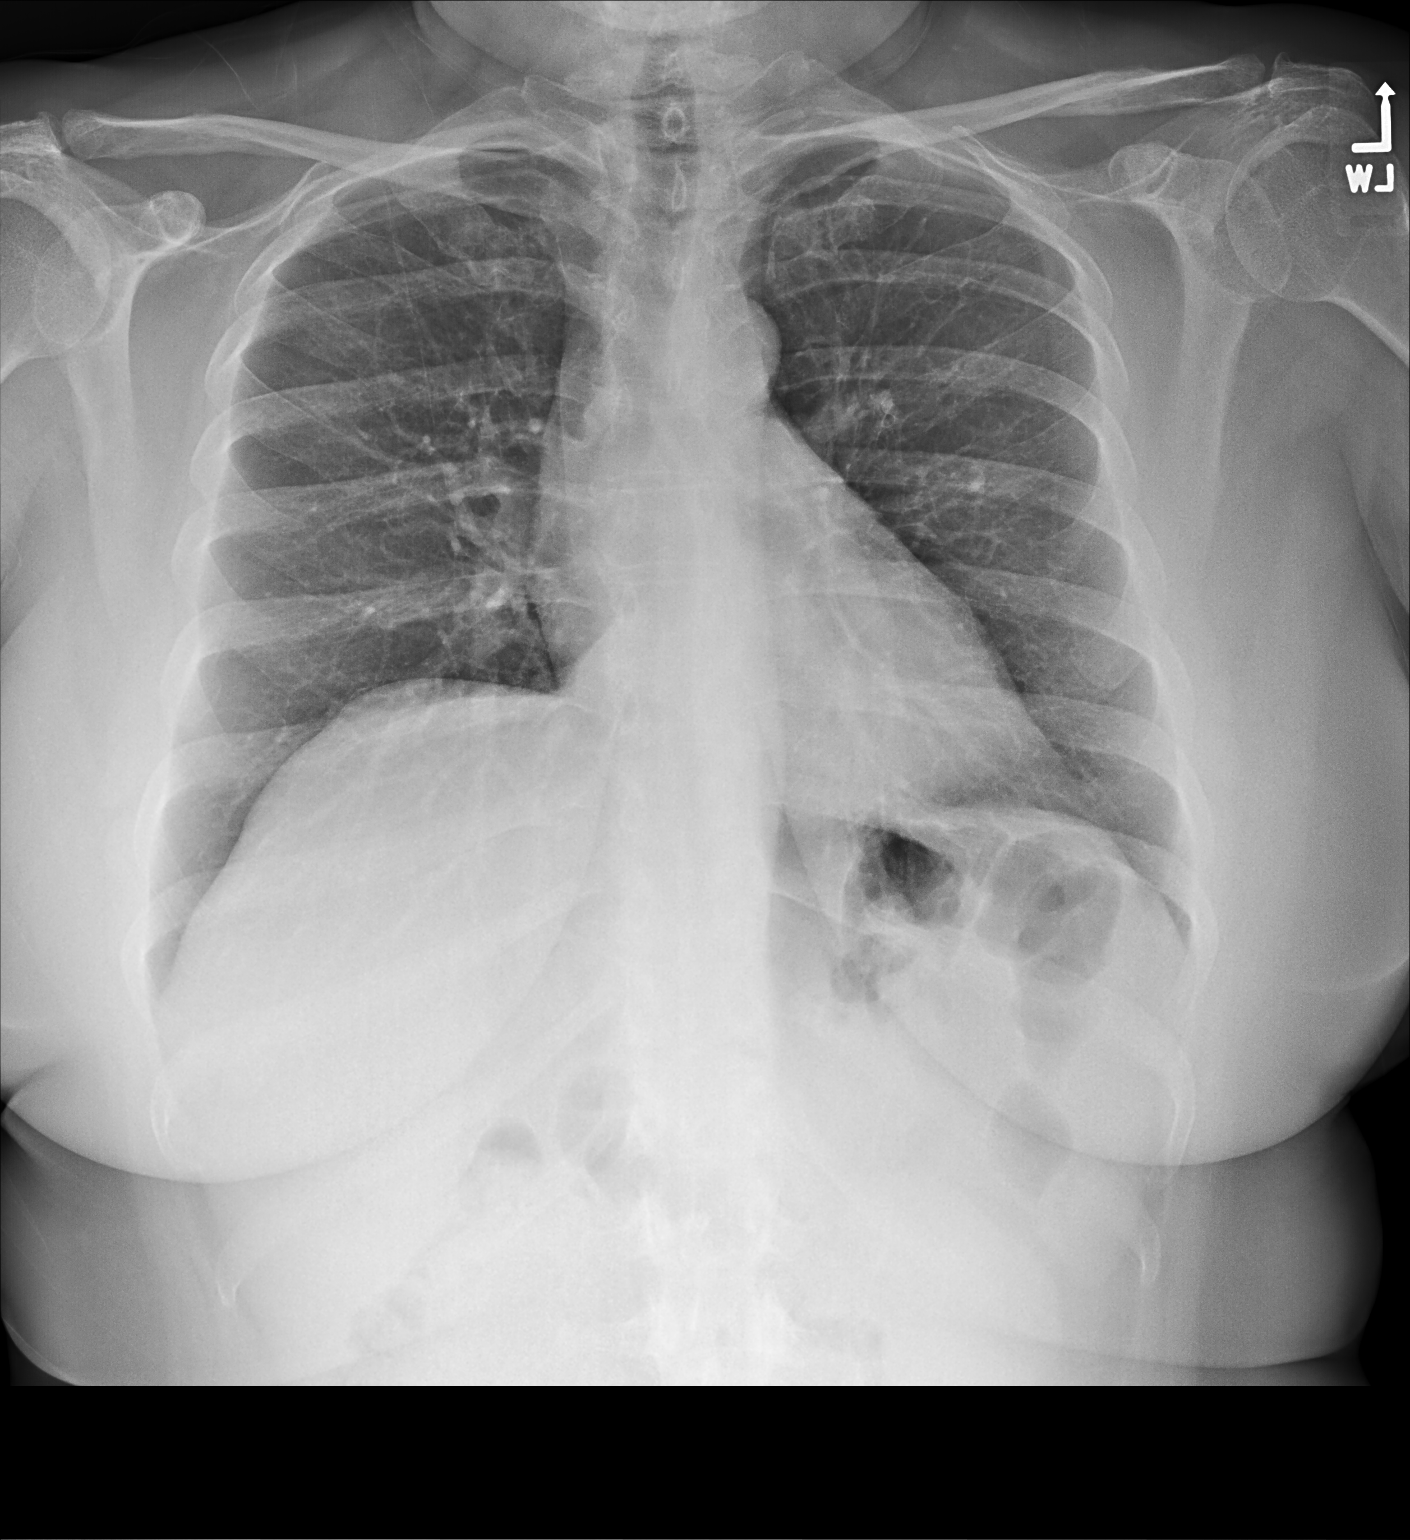

[chest lat]
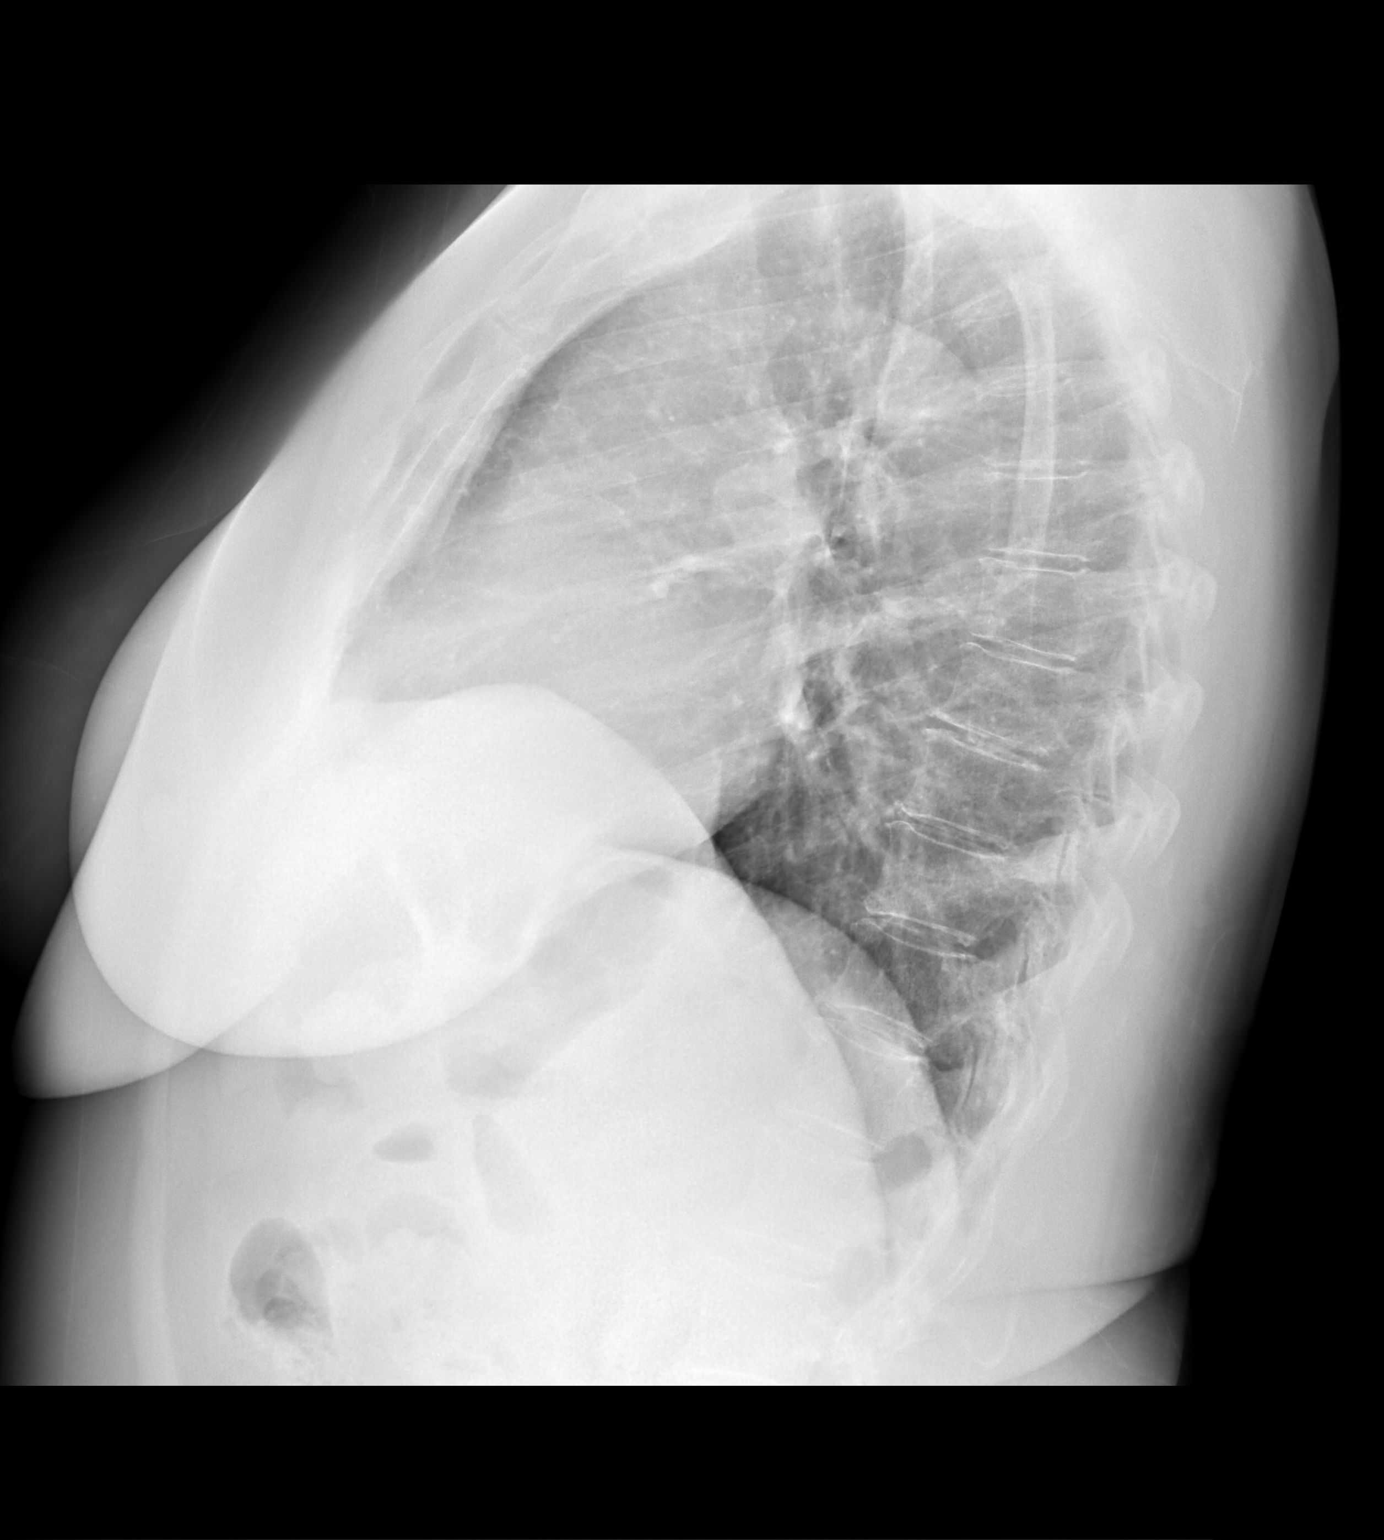

[2 of 2 positions shown; findings below may reference images not displayed]

FINDINGS: Cardiomediastinal silhouette within normal limits in size and
contour. No evidence of central vascular congestion. No interlobular
septal thickening.

No pneumothorax or pleural effusion. Coarsened interstitial
markings, with no confluent airspace disease.

No acute displaced fracture. Degenerative changes of the spine.
IMPRESSION: No active cardiopulmonary disease.

## 2022-06-06 ENCOUNTER — Ambulatory Visit
Admission: RE | Admit: 2022-06-06 | Discharge: 2022-06-06 | Disposition: A | Discharge status: Home / Self Care | Payer: 59 | Source: Ambulatory Visit | Attending: Family | Admitting: Family

## 2022-06-06 DIAGNOSIS — Z1231 Encounter for screening mammogram for malignant neoplasm of breast: Secondary | ICD-10-CM

## 2022-08-01 ENCOUNTER — Ambulatory Visit (INDEPENDENT_AMBULATORY_CARE_PROVIDER_SITE_OTHER): Payer: 59 | Admitting: *Deleted

## 2022-08-01 DIAGNOSIS — Z23 Encounter for immunization: Secondary | ICD-10-CM

## 2022-08-01 NOTE — Progress Notes (Signed)
Per orders of Dr. Lawerance Bach Dugal,FNP injection of shingles vaccine given by Emelia Salisbury. Patient tolerated injection well.

## 2022-10-30 ENCOUNTER — Encounter: Payer: Self-pay | Admitting: *Deleted

## 2022-10-30 ENCOUNTER — Encounter: Payer: Self-pay | Admitting: Family

## 2022-10-30 ENCOUNTER — Ambulatory Visit (INDEPENDENT_AMBULATORY_CARE_PROVIDER_SITE_OTHER): Payer: 59 | Admitting: Family

## 2022-10-30 VITALS — BP 128/72 | HR 64 | Temp 97.7°F | Ht 62.0 in | Wt 161.4 lb

## 2022-10-30 DIAGNOSIS — E7800 Pure hypercholesterolemia, unspecified: Secondary | ICD-10-CM | POA: Diagnosis not present

## 2022-10-30 DIAGNOSIS — F32A Depression, unspecified: Secondary | ICD-10-CM

## 2022-10-30 DIAGNOSIS — R7303 Prediabetes: Secondary | ICD-10-CM

## 2022-10-30 DIAGNOSIS — F419 Anxiety disorder, unspecified: Secondary | ICD-10-CM

## 2022-10-30 DIAGNOSIS — L989 Disorder of the skin and subcutaneous tissue, unspecified: Secondary | ICD-10-CM | POA: Diagnosis not present

## 2022-10-30 LAB — HEMOGLOBIN A1C: Hgb A1c MFr Bld: 6.2 % (ref 4.6–6.5)

## 2022-10-30 LAB — LIPID PANEL
Cholesterol: 213 mg/dL — ABNORMAL HIGH (ref 0–200)
HDL: 65.8 mg/dL (ref >39.00)
LDL Cholesterol: 121 mg/dL — ABNORMAL HIGH (ref 0–99)
NonHDL: 147.69
Total CHOL/HDL Ratio: 3
Triglycerides: 131 mg/dL (ref 0.0–149.0)
VLDL: 26.2 mg/dL (ref 0.0–40.0)

## 2022-10-30 NOTE — Assessment & Plan Note (Signed)
Continue pravastatin 40 mg once daily  Ordered lipid panel, pending results. Work on low cholesterol diet and exercise as tolerated

## 2022-10-30 NOTE — Patient Instructions (Signed)
A referral was placed today for dermatology.  Please let us know if you have not heard back within 2 weeks about the referral.  Stop by the lab prior to leaving today. I will notify you of your results once received.    Regards,   Rease Wence FNP-C   

## 2022-10-30 NOTE — Assessment & Plan Note (Signed)
Repeat A1c pending results Pt advised of the following: Work on a diabetic diet, try to incorporate exercise at least 20-30 a day for 3 days a week or more.

## 2022-10-30 NOTE — Assessment & Plan Note (Signed)
stable °

## 2022-10-30 NOTE — Progress Notes (Signed)
Established Patient Office Visit  Subjective:      CC:  Chief Complaint  Patient presents with   Medical Management of Chronic Issues    HPI: Regina Salazar is a 52 y.o. female presenting on 10/30/2022 for Medical Management of Chronic Issues . HLD: still taking pravastatin 40 mg nightly. Tolerating well.  Trying to work on a low cholesterol diet.  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  Lab Results  Component Value Date   CHOL 197 05/01/2022   HDL 68.10 05/01/2022   LDLCALC 113 (H) 05/01/2022   LDLDIRECT 147.3 04/29/2012   TRIG 83.0 05/01/2022   CHOLHDL 3 05/01/2022   Prediabetic:  Lab Results  Component Value Date   HGBA1C 6.1 05/01/2022   Anxiety/depression: went off the buspirone and bupropion as she was having trouble getting it filled, but states stress has been ok and she is doing well.      Social history:  Relevant past medical, surgical, family and social history reviewed and updated as indicated. Interim medical history since our last visit reviewed.  Allergies and medications reviewed and updated.  DATA REVIEWED: CHART IN EPIC     ROS: Negative unless specifically indicated above in HPI.    Current Outpatient Medications:    pravastatin (PRAVACHOL) 40 MG tablet, Take 1 tablet (40 mg total) by mouth daily., Disp: 90 tablet, Rfl: 3      Objective:    BP 128/72   Pulse 64   Temp 97.7 F (36.5 C) (Temporal)   Ht 5\' 2"  (1.575 m)   Wt 161 lb 6.4 oz (73.2 kg)   SpO2 99%   BMI 29.52 kg/m   Wt Readings from Last 3 Encounters:  10/30/22 161 lb 6.4 oz (73.2 kg)  05/01/22 160 lb 6 oz (72.7 kg)  01/18/22 171 lb (77.6 kg)    Physical Exam Constitutional:      General: She is not in acute distress.    Appearance: Normal appearance. She is normal weight. She is not ill-appearing, toxic-appearing or diaphoretic.  HENT:     Head: Normocephalic.   Cardiovascular:     Rate and Rhythm: Normal rate and regular rhythm.  Pulmonary:     Effort: Pulmonary effort is normal.  Musculoskeletal:        General: Normal range of motion.  Skin:    Comments: Left upper posterior shoulder with two small hypopigmented (brown) freckle like lesions   Neurological:     General: No focal deficit present.     Mental Status: She is alert and oriented to person, place, and time. Mental status is at baseline.  Psychiatric:        Mood and Affect: Mood normal.        Behavior: Behavior normal.        Thought Content: Thought content normal.        Judgment: Judgment normal.            Assessment & Plan:  Pure hypercholesterolemia Assessment & Plan: Continue pravastatin 40 mg once daily  Ordered lipid panel, pending results. Work on low cholesterol diet and exercise as tolerated   Orders: -     Lipid panel  Prediabetes Assessment & Plan: Repeat A1c pending results Pt advised of the following: Work on a diabetic diet, try to incorporate exercise at least 20-30 a day for 3 days a week or more.  Orders: -     Hemoglobin A1c  Anxiety and depression Assessment & Plan: stable   Skin lesion -     Ambulatory referral to Dermatology     Return in about 6 months (around 05/02/2023) for f/u CPE.  Mort Sawyers, MSN, APRN, FNP-C [facility]

## 2022-10-31 ENCOUNTER — Encounter: Payer: Self-pay | Admitting: Family

## 2022-10-31 ENCOUNTER — Other Ambulatory Visit: Payer: Self-pay | Admitting: Family

## 2022-10-31 DIAGNOSIS — E7800 Pure hypercholesterolemia, unspecified: Secondary | ICD-10-CM

## 2022-10-31 MED ORDER — PRAVASTATIN SODIUM 80 MG PO TABS
80.0000 mg | ORAL_TABLET | Freq: Every day | ORAL | 3 refills | Status: DC
Start: 2022-10-31 — End: 2022-11-05

## 2022-10-31 NOTE — Telephone Encounter (Signed)
Pravastatin was sent to [facility]. Please see patient question.

## 2022-11-05 ENCOUNTER — Other Ambulatory Visit: Payer: Self-pay | Admitting: Family

## 2022-11-05 DIAGNOSIS — E7800 Pure hypercholesterolemia, unspecified: Secondary | ICD-10-CM

## 2022-11-05 MED ORDER — PRAVASTATIN SODIUM 80 MG PO TABS: 80.0000 mg | ORAL_TABLET | Freq: Every day | ORAL | 3 refills | Status: DC

## 2022-12-28 ENCOUNTER — Other Ambulatory Visit: Payer: Self-pay | Admitting: Family

## 2022-12-28 DIAGNOSIS — Z1231 Encounter for screening mammogram for malignant neoplasm of breast: Secondary | ICD-10-CM

## 2023-02-06 ENCOUNTER — Encounter: Payer: Self-pay | Admitting: Dermatology

## 2023-02-06 ENCOUNTER — Ambulatory Visit (INDEPENDENT_AMBULATORY_CARE_PROVIDER_SITE_OTHER): Payer: 59 | Admitting: Dermatology

## 2023-02-06 VITALS — BP 129/86

## 2023-02-06 DIAGNOSIS — L814 Other melanin hyperpigmentation: Secondary | ICD-10-CM

## 2023-02-06 DIAGNOSIS — L821 Other seborrheic keratosis: Secondary | ICD-10-CM

## 2023-02-06 DIAGNOSIS — L570 Actinic keratosis: Secondary | ICD-10-CM

## 2023-02-06 DIAGNOSIS — L578 Other skin changes due to chronic exposure to nonionizing radiation: Secondary | ICD-10-CM

## 2023-02-06 NOTE — Progress Notes (Signed)
   New Patient Visit   Subjective  Regina Salazar is a 51 y.o. female who presents for the following: 2 brown growths at the left post shoulder x 13 years. She states they are enlarging. They are not itchy, scabby, or painful. No personal HX of skin Ca. Type of skin cancer in family of unknown.   The following portions of the chart were reviewed this encounter and updated as appropriate: medications, allergies, medical history  Review of Systems:  No other skin or systemic complaints except as noted in HPI or Assessment and Plan.  Objective  Well appearing patient in no apparent distress; mood and affect are within normal limits.   A focused examination was performed of the following areas: Left shoulder, chest  Relevant exam findings are noted in the Assessment and Plan.  Left Elbow - Posterior, chest, upper lip (4) Erythematous thin papules/macules with gritty scale.     Assessment & Plan     Actinic keratosis (4) Left Elbow - Posterior, chest, upper lip  Destruction of lesion - Left Elbow - Posterior, chest, upper lip (4) Complexity: simple   Destruction method: cryotherapy   Informed consent: discussed and consent obtained   Timeout:  patient name, date of birth, surgical site, and procedure verified Lesion destroyed using liquid nitrogen: Yes   Post-procedure details: wound care instructions given     SEBORRHEIC KERATOSIS - Stuck-on, waxy, tan-brown papules and/or plaques  - Benign-appearing - Discussed benign etiology and prognosis. - Observe - Call for any changes  1.0 cm brown waxy papule at the left post shoulder 2 smaller ones in the same area  LENTIGINES Exam: scattered tan macules Due to sun exposure Treatment Plan: Benign-appearing, observe. Recommend daily broad spectrum sunscreen SPF 30+ to sun-exposed areas, reapply every 2 hours as needed.  Call for any changes   Return in about 6 months (around 08/09/2023) for AK Follow Up, TBSE.  Jaclynn Guarneri, CMA, am acting as scribe for [employer], DO.   Documentation: I have reviewed the above documentation for accuracy and completeness, and I agree with the above.  Langston Reusing, DO

## 2023-02-06 NOTE — Patient Instructions (Addendum)
Cryotherapy Aftercare  Wash gently with soap and water everyday.   Apply Vaseline and Band-Aid daily until healed.     Important Information  Due to recent changes in healthcare laws, you may see results of your pathology and/or laboratory studies on MyChart before the doctors have had a chance to review them. We understand that in some cases there may be results that are confusing or concerning to you. Please understand that not all results are received at the same time and often the doctors may need to interpret multiple results in order to provide you with the best plan of care or course of treatment. Therefore, we ask that you please give [place] 2 business days to thoroughly review all your results before contacting the office for clarification. Should we see a critical lab result, you will be contacted sooner.   If You Need Anything After Your Visit  If you have any questions or concerns for your doctor, please call our main line at [phone] If no one answers, please leave a voicemail as directed and we will return your call as soon as possible. Messages left after 4 pm will be answered the following business day.   You may also send [place] a message via MyChart. We typically respond to MyChart messages within 1-2 business days.  For prescription refills, please ask your pharmacy to contact our office. Our fax number is [phone].  If you have an urgent issue when the clinic is closed that cannot wait until the next business day, you can page your doctor at the number below.    Please note that while we do our best to be available for urgent issues outside of office hours, we are not available 24/7.   If you have an urgent issue and are unable to reach [place], you may choose to seek medical care at your doctor's office, retail clinic, urgent care center, or emergency room.  If you have a medical emergency, please immediately call 911 or go to the emergency department. In the event of  inclement weather, please call our main line at [phone] for an update on the status of any delays or closures.  Dermatology Medication Tips: Please keep the boxes that topical medications come in in order to help keep track of the instructions about where and how to use these. Pharmacies typically print the medication instructions only on the boxes and not directly on the medication tubes.   If your medication is too expensive, please contact our office at [phone] or send [place] a message through MyChart.   We are unable to tell what your co-pay for medications will be in advance as this is different depending on your insurance coverage. However, we may be able to find a substitute medication at lower cost or fill out paperwork to get insurance to cover a needed medication.   If a prior authorization is required to get your medication covered by your insurance company, please allow [place] 1-2 business days to complete this process.  Drug prices often vary depending on where the prescription is filled and some pharmacies may offer cheaper prices.  The website www.goodrx.com contains coupons for medications through different pharmacies. The prices here do not account for what the cost may be with help from insurance (it may be cheaper with your insurance), but the website can give you the price if you did not use any insurance.  - You can print the associated coupon and take it with your prescription to the pharmacy.  -  You may also stop by our office during regular business hours and pick up a GoodRx coupon card.  - If you need your prescription sent electronically to a different pharmacy, notify our office through [facility] or by phone at [phone]

## 2023-05-07 ENCOUNTER — Ambulatory Visit (INDEPENDENT_AMBULATORY_CARE_PROVIDER_SITE_OTHER): Payer: 59 | Admitting: Family

## 2023-05-07 ENCOUNTER — Encounter: Payer: Self-pay | Admitting: Family

## 2023-05-07 VITALS — BP 126/86 | HR 64 | Temp 98.0°F | Ht 62.0 in | Wt 159.2 lb

## 2023-05-07 DIAGNOSIS — Z79899 Other long term (current) drug therapy: Secondary | ICD-10-CM

## 2023-05-07 DIAGNOSIS — E7800 Pure hypercholesterolemia, unspecified: Secondary | ICD-10-CM | POA: Diagnosis not present

## 2023-05-07 DIAGNOSIS — Z5320 Procedure and treatment not carried out because of patient's decision for unspecified reasons: Secondary | ICD-10-CM

## 2023-05-07 DIAGNOSIS — R7303 Prediabetes: Secondary | ICD-10-CM | POA: Diagnosis not present

## 2023-05-07 DIAGNOSIS — F419 Anxiety disorder, unspecified: Secondary | ICD-10-CM

## 2023-05-07 DIAGNOSIS — Z0000 Encounter for general adult medical examination without abnormal findings: Secondary | ICD-10-CM | POA: Diagnosis not present

## 2023-05-07 DIAGNOSIS — E66811 Obesity, class 1: Secondary | ICD-10-CM

## 2023-05-07 DIAGNOSIS — F32A Depression, unspecified: Secondary | ICD-10-CM

## 2023-05-07 DIAGNOSIS — E6609 Other obesity due to excess calories: Secondary | ICD-10-CM

## 2023-05-07 DIAGNOSIS — Z6831 Body mass index (BMI) 31.0-31.9, adult: Secondary | ICD-10-CM

## 2023-05-07 LAB — COMPREHENSIVE METABOLIC PANEL
ALT: 22 U/L (ref 0–35)
AST: 21 U/L (ref 0–37)
Albumin: 4.7 g/dL (ref 3.5–5.2)
Alkaline Phosphatase: 68 U/L (ref 39–117)
BUN: 20 mg/dL (ref 6–23)
CO2: 25 meq/L (ref 19–32)
Calcium: 9.3 mg/dL (ref 8.4–10.5)
Chloride: 107 meq/L (ref 96–112)
Creatinine, Ser: 0.73 mg/dL (ref 0.40–1.20)
GFR: 93.68 mL/min (ref >60.00)
Glucose, Bld: 101 mg/dL — ABNORMAL HIGH (ref 70–99)
Potassium: 4.1 meq/L (ref 3.5–5.1)
Sodium: 141 meq/L (ref 135–145)
Total Bilirubin: 0.9 mg/dL (ref 0.2–1.2)
Total Protein: 7.2 g/dL (ref 6.0–8.3)

## 2023-05-07 LAB — LIPID PANEL
Cholesterol: 176 mg/dL (ref 0–200)
HDL: 59.3 mg/dL (ref >39.00)
LDL Cholesterol: 99 mg/dL (ref 0–99)
NonHDL: 116.75
Total CHOL/HDL Ratio: 3
Triglycerides: 87 mg/dL (ref 0.0–149.0)
VLDL: 17.4 mg/dL (ref 0.0–40.0)

## 2023-05-07 LAB — HEMOGLOBIN A1C: Hgb A1c MFr Bld: 6.3 % (ref 4.6–6.5)

## 2023-05-07 NOTE — Progress Notes (Unsigned)
Subjective:  Patient ID: Regina Salazar, female    DOB: Jan 20, 1971  Age: 52 y.o. MRN: [id number]  Patient Care Team: Mort Sawyers, FNP as PCP - General (Family Medicine)   CC:  Chief Complaint  Patient presents with  . Annual Exam    HPI Regina Salazar is a 52 y.o. female who presents today for an annual physical exam. She reports consuming a general and avoids red meat  diet. Home exercise routine includes going to the gym about to restart routine, 3-4 times weekly. She generally feels well. She reports sleeping fairly well. She does not have additional problems to discuss today.   Vision:Within last year Dental:Receives regular dental care  Mammogram: scheduled 06/10/23 Last pap: 2013, last one was then. Has had to go under anesthesia to get this in the past.  Colonoscopy:05/08/19 every five years.  Bone density scan: < 77 y/o  Pt is without acute concerns.   Skin lesion: had MOHS upper lip, skin cancer per pt and also on back of left arm. Not melanoma, has f/u feb 2025.   Advanced Directives Patient does not have advanced directives   DEPRESSION SCREENING    05/07/2023    8:36 AM 10/30/2022    8:50 AM 08/31/2021    3:55 PM 06/26/2021   11:38 AM 06/26/2021   11:13 AM 11/09/2020    1:56 PM 07/21/2020   10:57 AM  PHQ 2/9 Scores  PHQ - 2 Score 0 0 0 1 0 0 0  PHQ- 9 Score 0 0 0 5   0     ROS: Negative unless specifically indicated above in HPI.    Current Outpatient Medications:  .  pravastatin (PRAVACHOL) 80 MG tablet, Take 1 tablet (80 mg total) by mouth daily., Disp: 90 tablet, Rfl: 3    Objective:    BP 126/86 (BP Location: Left Arm, Patient Position: Sitting, Cuff Size: Normal)   Pulse 64   Temp 98 F (36.7 C) (Temporal)   Ht 5\' 2"  (1.575 m)   Wt 159 lb 3.2 oz (72.2 kg)   SpO2 98%   BMI 29.12 kg/m   BP Readings from Last 3 Encounters:  05/07/23 126/86  02/06/23 129/86  10/30/22 128/72      Physical Exam Constitutional:      General: She is  not in acute distress.    Appearance: Normal appearance. She is normal weight. She is not ill-appearing.  HENT:     Head: Normocephalic.     Right Ear: Tympanic membrane normal.     Left Ear: Tympanic membrane normal.     Nose: Nose normal.     Mouth/Throat:     Mouth: Mucous membranes are moist.  Eyes:     Extraocular Movements: Extraocular movements intact.     Pupils: Pupils are equal, round, and reactive to light.  Cardiovascular:     Rate and Rhythm: Normal rate and regular rhythm.  Pulmonary:     Effort: Pulmonary effort is normal.     Breath sounds: Normal breath sounds.  Abdominal:     General: Abdomen is flat. Bowel sounds are normal.     Palpations: Abdomen is soft.     Tenderness: There is no guarding or rebound.  Musculoskeletal:        General: Normal range of motion.     Cervical back: Normal range of motion.  Skin:    General: Skin is warm.     Capillary Refill: Capillary refill takes less than 2  seconds.  Neurological:     General: No focal deficit present.     Mental Status: She is alert.  Psychiatric:        Mood and Affect: Mood normal.        Behavior: Behavior normal.        Thought Content: Thought content normal.        Judgment: Judgment normal.         Assessment & Plan:  Encounter for general adult medical examination without abnormal findings -     Lipid panel -     Hemoglobin A1c -     Comprehensive metabolic panel  Pure hypercholesterolemia -     Lipid panel -     Lipoprotein A (LPA)  On statin therapy -     Lipid panel  Prediabetes -     Hemoglobin A1c  Pap smear of cervix declined      Follow-up: Return in about 6 months (around 11/04/2023) for f/u cholesterol.   Mort Sawyers, FNP

## 2023-05-09 DIAGNOSIS — Z0000 Encounter for general adult medical examination without abnormal findings: Secondary | ICD-10-CM | POA: Insufficient documentation

## 2023-05-09 DIAGNOSIS — Z5320 Procedure and treatment not carried out because of patient's decision for unspecified reasons: Secondary | ICD-10-CM | POA: Insufficient documentation

## 2023-05-09 NOTE — Assessment & Plan Note (Signed)
Pt advised of the following: Work on a diabetic diet, try to incorporate exercise at least 20-30 a day for 3 days a week or more.  A1c ordered pending results. 

## 2023-05-09 NOTE — Assessment & Plan Note (Addendum)
Continue pravastatin 80 mg once daily  Ordered lipid panel, pending results. Work on low cholesterol diet and exercise as tolerated

## 2023-05-09 NOTE — Assessment & Plan Note (Signed)

## 2023-05-09 NOTE — Assessment & Plan Note (Signed)
 Stable without medication management.

## 2023-05-09 NOTE — Assessment & Plan Note (Signed)
Pt advised to work on diet and exercise as tolerated  

## 2023-05-13 LAB — LIPOPROTEIN A (LPA): Lipoprotein (a): 32 nmol/L (ref <75)

## 2023-06-10 ENCOUNTER — Inpatient Hospital Stay
Admission: RE | Admit: 2023-06-10 | Discharge: 2023-06-10 | Discharge status: Home / Self Care | Payer: 59 | Source: Ambulatory Visit | Attending: Family

## 2023-06-10 DIAGNOSIS — Z1231 Encounter for screening mammogram for malignant neoplasm of breast: Secondary | ICD-10-CM

## 2023-08-08 ENCOUNTER — Ambulatory Visit: Payer: 59 | Admitting: Dermatology

## 2023-08-28 ENCOUNTER — Ambulatory Visit: Payer: 59 | Admitting: Dermatology

## 2023-09-23 ENCOUNTER — Ambulatory Visit: Admitting: Dermatology

## 2023-09-23 ENCOUNTER — Encounter: Payer: Self-pay | Admitting: Dermatology

## 2023-09-23 VITALS — BP 143/95

## 2023-09-23 DIAGNOSIS — L570 Actinic keratosis: Secondary | ICD-10-CM | POA: Diagnosis not present

## 2023-09-23 DIAGNOSIS — W908XXA Exposure to other nonionizing radiation, initial encounter: Secondary | ICD-10-CM

## 2023-09-23 DIAGNOSIS — L821 Other seborrheic keratosis: Secondary | ICD-10-CM

## 2023-09-23 DIAGNOSIS — Z1283 Encounter for screening for malignant neoplasm of skin: Secondary | ICD-10-CM | POA: Diagnosis not present

## 2023-09-23 DIAGNOSIS — D1801 Hemangioma of skin and subcutaneous tissue: Secondary | ICD-10-CM

## 2023-09-23 DIAGNOSIS — L814 Other melanin hyperpigmentation: Secondary | ICD-10-CM | POA: Diagnosis not present

## 2023-09-23 DIAGNOSIS — L578 Other skin changes due to chronic exposure to nonionizing radiation: Secondary | ICD-10-CM

## 2023-09-23 DIAGNOSIS — D229 Melanocytic nevi, unspecified: Secondary | ICD-10-CM

## 2023-09-23 NOTE — Patient Instructions (Addendum)

## 2023-09-23 NOTE — Progress Notes (Signed)
   Follow-Up Visit   Subjective  Regina Salazar is a 53 y.o. female who presents for the following: TBSE  Patient present today for follow up visit. Patient was last evaluated on 02/06/23. At this visit patient was prescribed n/a. Patient denies medication changes.  The following portions of the chart were reviewed this encounter and updated as appropriate: medications, allergies, medical history  Review of Systems:  No other skin or systemic complaints except as noted in HPI or Assessment and Plan.  Objective  Well appearing patient in no apparent distress; mood and affect are within normal limits.  A full examination was performed including scalp, head, eyes, ears, nose, lips, neck, chest, axillae, abdomen, back, buttocks, bilateral upper extremities, bilateral lower extremities, hands, feet, fingers, toes, fingernails, and toenails. All findings within normal limits unless otherwise noted below.    Relevant exam findings are noted in the Assessment and Plan.  Left Elbow - Posterior, Mid Parietal Scalp (4) Erythematous thin papules/macules with gritty scale.   Assessment & Plan   LENTIGINES, SEBORRHEIC KERATOSES, HEMANGIOMAS - Benign normal skin lesions - Benign-appearing - Call for any changes  BENIGN MELANOCYTIC NEVI - Tan-brown and/or pink-flesh-colored symmetric macules and papules - Benign appearing on exam today - Observation - Call clinic for new or changing moles - Recommend daily use of broad spectrum spf 30+ sunscreen to sun-exposed areas.   MILD ACTINIC DAMAGE - Chronic condition, secondary to cumulative UV/sun exposure - diffuse scaly erythematous macules with underlying dyspigmentation - Recommend daily broad spectrum sunscreen SPF 30+ to sun-exposed areas, reapply every 2 hours as needed.  - Staying in the shade or wearing long sleeves, sun glasses (UVA+UVB protection) and wide brim hats (4-inch brim around the entire circumference of the hat) are also  recommended for sun protection.  - Call for new or changing lesions.  SKIN CANCER SCREENING PERFORMED TODAY  AK (ACTINIC KERATOSIS) (5) Left Elbow - Posterior, Mid Parietal Scalp (4) Destruction of lesion - Left Elbow - Posterior, Mid Parietal Scalp (4) Complexity: simple   Destruction method: cryotherapy   Informed consent: discussed and consent obtained   Timeout:  patient name, date of birth, surgical site, and procedure verified Lesion destroyed using liquid nitrogen: Yes   Region frozen until ice ball extended beyond lesion: Yes   Outcome: patient tolerated procedure well with no complications   Post-procedure details: wound care instructions given   SKIN EXAM FOR MALIGNANT NEOPLASM   MULTIPLE BENIGN MELANOCYTIC NEVI   CHERRY ANGIOMA   LENTIGINES   SEBORRHEIC KERATOSIS   ACTINIC SKIN DAMAGE    No follow-ups on file.    Documentation: I have reviewed the above documentation for accuracy and completeness, and I agree with the above.  I, Shirron Louanne Roussel, CMA, am acting as scribe for [employer], DO.   Louana Roup, DO

## 2023-10-02 ENCOUNTER — Encounter: Payer: Self-pay | Admitting: Family

## 2023-10-02 DIAGNOSIS — E7800 Pure hypercholesterolemia, unspecified: Secondary | ICD-10-CM

## 2023-10-03 MED ORDER — PRAVASTATIN SODIUM 80 MG PO TABS: 80.0000 mg | ORAL_TABLET | Freq: Every day | ORAL | 0 refills | Status: DC

## 2023-10-04 ENCOUNTER — Other Ambulatory Visit: Payer: Self-pay | Admitting: Family

## 2023-10-04 DIAGNOSIS — E7800 Pure hypercholesterolemia, unspecified: Secondary | ICD-10-CM

## 2023-10-07 NOTE — Telephone Encounter (Signed)
 LVM to schedule Office Visit

## 2023-10-07 NOTE — Telephone Encounter (Signed)
 Patient scheduled.

## 2023-10-23 ENCOUNTER — Encounter: Payer: Self-pay | Admitting: Family

## 2023-10-23 ENCOUNTER — Ambulatory Visit: Admitting: Family

## 2023-10-23 ENCOUNTER — Ambulatory Visit: Payer: 59 | Admitting: Dermatology

## 2023-10-23 VITALS — BP 132/82 | HR 78 | Temp 97.8°F | Ht 62.0 in | Wt 156.8 lb

## 2023-10-23 DIAGNOSIS — Z79899 Other long term (current) drug therapy: Secondary | ICD-10-CM

## 2023-10-23 DIAGNOSIS — E7800 Pure hypercholesterolemia, unspecified: Secondary | ICD-10-CM | POA: Diagnosis not present

## 2023-10-23 DIAGNOSIS — E66811 Obesity, class 1: Secondary | ICD-10-CM | POA: Diagnosis not present

## 2023-10-23 DIAGNOSIS — Z1211 Encounter for screening for malignant neoplasm of colon: Secondary | ICD-10-CM

## 2023-10-23 DIAGNOSIS — R7303 Prediabetes: Secondary | ICD-10-CM | POA: Diagnosis not present

## 2023-10-23 DIAGNOSIS — E6609 Other obesity due to excess calories: Secondary | ICD-10-CM

## 2023-10-23 DIAGNOSIS — Z6831 Body mass index (BMI) 31.0-31.9, adult: Secondary | ICD-10-CM

## 2023-10-23 LAB — LIPID PANEL
Cholesterol: 182 mg/dL (ref 0–200)
HDL: 60.2 mg/dL (ref >39.00)
LDL Cholesterol: 102 mg/dL — ABNORMAL HIGH (ref 0–99)
NonHDL: 122.05
Total CHOL/HDL Ratio: 3
Triglycerides: 98 mg/dL (ref 0.0–149.0)
VLDL: 19.6 mg/dL (ref 0.0–40.0)

## 2023-10-23 LAB — COMPREHENSIVE METABOLIC PANEL WITH GFR
ALT: 23 U/L (ref 0–35)
AST: 23 U/L (ref 0–37)
Albumin: 4.7 g/dL (ref 3.5–5.2)
Alkaline Phosphatase: 68 U/L (ref 39–117)
BUN: 16 mg/dL (ref 6–23)
CO2: 25 meq/L (ref 19–32)
Calcium: 9.3 mg/dL (ref 8.4–10.5)
Chloride: 105 meq/L (ref 96–112)
Creatinine, Ser: 0.77 mg/dL (ref 0.40–1.20)
GFR: 87.59 mL/min (ref >60.00)
Glucose, Bld: 98 mg/dL (ref 70–99)
Potassium: 4 meq/L (ref 3.5–5.1)
Sodium: 139 meq/L (ref 135–145)
Total Bilirubin: 0.6 mg/dL (ref 0.2–1.2)
Total Protein: 7.6 g/dL (ref 6.0–8.3)

## 2023-10-23 LAB — HEMOGLOBIN A1C: Hgb A1c MFr Bld: 6.3 % (ref 4.6–6.5)

## 2023-10-23 MED ORDER — PRAVASTATIN SODIUM 80 MG PO TABS: 80.0000 mg | ORAL_TABLET | Freq: Every day | ORAL | 3 refills | Status: AC

## 2023-10-23 NOTE — Assessment & Plan Note (Signed)
 Pt advised to work on diet and exercise as tolerated

## 2023-10-23 NOTE — Assessment & Plan Note (Signed)
 Pt advised of the following: Work on a diabetic diet, try to incorporate exercise at least 20-30 a day for 3 days a week or more.  A1c ordered pending results

## 2023-10-23 NOTE — Assessment & Plan Note (Signed)
 Ordered lipid panel, pending results. Work on low cholesterol diet and exercise as tolerated Refill pravastatin  80 mg

## 2023-10-23 NOTE — Progress Notes (Signed)
 '  Established Patient Office Visit  Subjective:      CC:  Chief Complaint  Patient presents with   Medical Management of Chronic Issues    6 month follow up    HPI: Regina Salazar is a 53 y.o. female presenting on 10/23/2023 for Medical Management of Chronic Issues (6 month follow up)  Prediabetics: last A1c 6.3, no exercise at current. 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 Tries to watch what she eats but mainly is always on the go and doesn't always make the best choices      Social history:  Relevant past medical, surgical, family and social history reviewed and updated as indicated. Interim medical history since our last visit reviewed.  Allergies and medications reviewed and updated.  DATA REVIEWED: CHART IN EPIC     ROS: Negative unless specifically indicated above in HPI.    Current Outpatient Medications:    pravastatin  (PRAVACHOL ) 80 MG tablet, TAKE 1 TABLET BY MOUTH DAILY, Disp: 90 tablet, Rfl: 3      Objective:    BP 132/82 (BP Location: Left Arm, Patient Position: Sitting, Cuff Size: Normal)   Pulse 78   Temp 97.8 F (36.6 C) (Temporal)   Ht 5\' 2"  (1.575 m)   Wt 156 lb 12.8 oz (71.1 kg)   SpO2 98%   BMI 28.68 kg/m   Wt Readings from Last 3 Encounters:  10/23/23 156 lb 12.8 oz (71.1 kg)  05/07/23 159 lb 3.2 oz (72.2 kg)  10/30/22 161 lb 6.4 oz (73.2 kg)    Physical Exam Constitutional:      General: She is not in acute distress.    Appearance: Normal appearance. She is normal weight. She is not ill-appearing, toxic-appearing or diaphoretic.  HENT:     Head: Normocephalic.  Cardiovascular:     Rate and Rhythm: Normal rate and regular rhythm.  Pulmonary:     Effort: Pulmonary effort is normal.     Breath sounds: Normal breath sounds.  Musculoskeletal:        General: Normal range of motion.  Neurological:     General: No focal deficit present.     Mental Status: She is alert and oriented to person, place, and  time. Mental status is at baseline.  Psychiatric:        Mood and Affect: Mood normal.        Behavior: Behavior normal.        Thought Content: Thought content normal.        Judgment: Judgment normal.           Assessment & Plan:  Prediabetes Assessment & Plan: Pt advised of the following: Work on a diabetic diet, try to incorporate exercise at least 20-30 a day for 3 days a week or more.   A1c ordered pending results.     Orders: -     Comprehensive metabolic panel with GFR -     Hemoglobin A1c  Pure hypercholesterolemia Assessment & Plan: Ordered lipid panel, pending results. Work on low cholesterol diet and exercise as tolerated Refill pravastatin  80 mg   Orders: -     Lipid panel  Class 1 obesity due to excess calories without serious comorbidity with body mass index (BMI) of 31.0 to 31.9 in adult Assessment & Plan: Pt advised to work on diet and exercise as tolerated    On statin therapy -     Comprehensive metabolic panel with GFR  Screening for colon cancer -  Ambulatory referral to Gastroenterology     Return in about 6 months (around 04/24/2024) for f/u CPE.  Felicita Horns, MSN, APRN, FNP-C [facility]

## 2023-10-24 ENCOUNTER — Encounter: Payer: Self-pay | Admitting: Family

## 2023-11-19 ENCOUNTER — Telehealth: Payer: Self-pay

## 2023-11-19 ENCOUNTER — Other Ambulatory Visit: Payer: Self-pay

## 2023-11-19 DIAGNOSIS — Z860100 Personal history of colon polyps, unspecified: Secondary | ICD-10-CM

## 2023-11-19 DIAGNOSIS — Z800 Family history of malignant neoplasm of digestive organs: Secondary | ICD-10-CM

## 2023-11-19 MED ORDER — NA SULFATE-K SULFATE-MG SULF 17.5-3.13-1.6 GM/177ML PO SOLN: 1.0000 | Freq: Once | ORAL | 0 refills | Status: AC

## 2023-11-19 NOTE — Telephone Encounter (Signed)
 Gastroenterology Pre-Procedure Review  Request Date: 01/23/24 Requesting Physician: Dr. Ole Berkeley  PATIENT REVIEW QUESTIONS: The patient responded to the following health history questions as indicated:    1. Are you having any GI issues? no 2. Do you have a personal history of Polyps? yes (last colonoscopy performed by Dr. Tully Gainer 05/08/19 recommended repeat in 5 years however father had colon cancer scheduled sooner) 3. Do you have a family history of Colon Cancer or Polyps? yes (father colon cancer) 4. Diabetes Mellitus? Pre-diabetice 5. Joint replacements in the past 12 months?no 6. Major health problems in the past 3 months?no 7. Any artificial heart valves, MVP, or defibrillator?no    MEDICATIONS & ALLERGIES:    Patient reports the following regarding taking any anticoagulation/antiplatelet therapy:   Plavix, Coumadin, Eliquis, Xarelto, Lovenox, Pradaxa, Brilinta, or Effient? no Aspirin? no  Patient confirms/reports the following medications:  Current Outpatient Medications  Medication Sig Dispense Refill   Na Sulfate-K Sulfate-Mg Sulfate concentrate (SUPREP) 17.5-3.13-1.6 GM/177ML SOLN Take 1 kit (354 mLs total) by mouth once for 1 dose. 354 mL 0   pravastatin  (PRAVACHOL ) 80 MG tablet Take 1 tablet (80 mg total) by mouth daily. 90 tablet 3   No current facility-administered medications for this visit.    Patient confirms/reports the following allergies:  Allergies  Allergen Reactions   Prednisone  Anxiety    Induces her anxiety    Orders Placed This Encounter  Procedures   Ambulatory referral to Gastroenterology    Referral Priority:   Routine    Referral Type:   Consultation    Referral Reason:   Specialty Services Required    Number of Visits Requested:   1    AUTHORIZATION INFORMATION Primary Insurance: 1D#: Group #:  Secondary Insurance: 1D#: Group #:  SCHEDULE INFORMATION: Date: 01/23/24 Time: Location: [facility]

## 2024-01-01 ENCOUNTER — Encounter: Payer: Self-pay | Admitting: Dermatology

## 2024-01-01 ENCOUNTER — Ambulatory Visit (INDEPENDENT_AMBULATORY_CARE_PROVIDER_SITE_OTHER): Admitting: Dermatology

## 2024-01-01 VITALS — BP 148/84 | HR 79

## 2024-01-01 DIAGNOSIS — L820 Inflamed seborrheic keratosis: Secondary | ICD-10-CM | POA: Diagnosis not present

## 2024-01-01 DIAGNOSIS — W908XXA Exposure to other nonionizing radiation, initial encounter: Secondary | ICD-10-CM

## 2024-01-01 DIAGNOSIS — L821 Other seborrheic keratosis: Secondary | ICD-10-CM

## 2024-01-01 DIAGNOSIS — L570 Actinic keratosis: Secondary | ICD-10-CM

## 2024-01-01 DIAGNOSIS — L578 Other skin changes due to chronic exposure to nonionizing radiation: Secondary | ICD-10-CM

## 2024-01-01 NOTE — Patient Instructions (Addendum)

## 2024-01-01 NOTE — Progress Notes (Unsigned)
   Follow-Up Visit   Subjective  Regina Salazar is a 53 y.o. female who presents for the following: SK  Patient present today for follow up visit for Sk. Patient was last evaluated on 09/23/23. At this visit patient had Cryo therapy completed. Patient reports the area was scaly and itchy. The area then fell off but she still would like to have it examined today. Patient  medication changes.  The following portions of the chart were reviewed this encounter and updated as appropriate: medications, allergies, medical history  Review of Systems:  No other skin or systemic complaints except as noted in HPI or Assessment and Plan.  Objective  Well appearing patient in no apparent distress; mood and affect are within normal limits.  A focused examination was performed of the following areas: Right Chest, Vertex Scalp  Relevant exam findings are noted in the Assessment and Plan.  Mid Parietal Scalp Brown waxy papule  Assessment & Plan    1. Resolved chest lesion - Assessment: Patient reported a brown, flaky lesion on her chest that spontaneously resolved. Based on the description and clinical course, this was most likely a seborrheic keratosis. These benign lesions are typically brown, waxy, and stuck-on in appearance. They can become irritated, flaky, and fall off on their own, which aligns with the patient's experience. The spontaneous resolution is reassuring, as skin cancers or pre-cancers typically do not resolve without treatment. - Plan:    Reassurance provided about benign nature of the resolved lesion    Patient educated on characteristics of seborrheic keratoses and their benign nature    Advised to save any similar lesions that may appear in the future for evaluation at next visit    Continue regular skin checks and sun protection measures  2. Actinic keratoses (AKs) - Assessment: Patient has a history of actinic keratoses, which were previously treated with cryotherapy on the  elbow and scapula in April. These pre-cancerous lesions are related to sun exposure and require ongoing monitoring and treatment as needed. - Plan:    Continued monitoring for new or recurrent actinic keratoses    Emphasized importance of sun protection measures    Recommended continued use of Neutrogena sunscreen on face, neck, and chest    Provided samples of various sunscreens for patient to try  3. Seborrheic keratosis on scalp - Assessment: A seborrheic keratosis was identified on the patient's scalp. This benign lesion is consistent with the patient's age and is not related to sun exposure or skin cancer risk. - Plan:    Cryotherapy performed on the seborrheic keratosis    Patient informed about the benign nature of the lesion    Follow-up as needed for any concerns or new lesions INFLAMED SEBORRHEIC KERATOSIS Mid Parietal Scalp Destruction of lesion - Mid Parietal Scalp Complexity: simple   Destruction method: cryotherapy   Informed consent: discussed and consent obtained   Timeout:  patient name, date of birth, surgical site, and procedure verified Lesion destroyed using liquid nitrogen: Yes   Region frozen until ice ball extended beyond lesion: Yes   Outcome: patient tolerated procedure well with no complications   Post-procedure details: wound care instructions given     Return in about 9 months (around 10/12/2024) for TBSE.  I, Jetta Ager, am acting as Neurosurgeon for [employer], DO.  Documentation: I have reviewed the above documentation for accuracy and completeness, and I agree with the above.  Delon Lenis, DO

## 2024-01-13 ENCOUNTER — Telehealth: Payer: Self-pay

## 2024-01-13 NOTE — Telephone Encounter (Signed)
 Patient called asking to change her procedure date from 01/23/2024 to 04/23/2024 with Dr. Jinny. I told her that we sure can. I then called the endo unit and spoke to Trish to change the date and she stated that she would.

## 2024-03-03 ENCOUNTER — Other Ambulatory Visit: Payer: Self-pay | Admitting: Family

## 2024-03-03 DIAGNOSIS — Z1231 Encounter for screening mammogram for malignant neoplasm of breast: Secondary | ICD-10-CM

## 2024-04-23 ENCOUNTER — Ambulatory Visit: Admitting: Anesthesiology

## 2024-04-23 ENCOUNTER — Encounter
Admission: RE | Disposition: A | Discharge status: Home / Self Care | Payer: Self-pay | Source: Home / Self Care | Attending: Gastroenterology

## 2024-04-23 ENCOUNTER — Encounter: Payer: Self-pay | Admitting: Gastroenterology

## 2024-04-23 ENCOUNTER — Ambulatory Visit
Admission: RE | Admit: 2024-04-23 | Discharge: 2024-04-23 | Disposition: A | Discharge status: Home / Self Care | Attending: Gastroenterology | Admitting: Gastroenterology

## 2024-04-23 DIAGNOSIS — Z860100 Personal history of colon polyps, unspecified: Secondary | ICD-10-CM

## 2024-04-23 DIAGNOSIS — Z860101 Personal history of adenomatous and serrated colon polyps: Secondary | ICD-10-CM

## 2024-04-23 DIAGNOSIS — Z800 Family history of malignant neoplasm of digestive organs: Secondary | ICD-10-CM | POA: Diagnosis not present

## 2024-04-23 DIAGNOSIS — K641 Second degree hemorrhoids: Secondary | ICD-10-CM

## 2024-04-23 DIAGNOSIS — Z1211 Encounter for screening for malignant neoplasm of colon: Secondary | ICD-10-CM

## 2024-04-23 DIAGNOSIS — Z83719 Family history of colon polyps, unspecified: Secondary | ICD-10-CM | POA: Insufficient documentation

## 2024-04-23 HISTORY — PX: COLONOSCOPY: SHX5424

## 2024-04-23 SURGERY — COLONOSCOPY
Anesthesia: General

## 2024-04-23 MED ORDER — PROPOFOL 10 MG/ML IV BOLUS
INTRAVENOUS | Status: DC | PRN
  Administered 2024-04-23: 100 mg via INTRAVENOUS

## 2024-04-23 MED ORDER — EPHEDRINE 5 MG/ML INJ
INTRAVENOUS | Status: AC
  Filled 2024-04-23: qty 5

## 2024-04-23 MED ORDER — SODIUM CHLORIDE 0.9 % IV SOLN
INTRAVENOUS | Status: DC
  Administered 2024-04-23: 500 mL via INTRAVENOUS

## 2024-04-23 MED ORDER — EPHEDRINE SULFATE-NACL 50-0.9 MG/10ML-% IV SOSY
PREFILLED_SYRINGE | INTRAVENOUS | Status: DC | PRN
  Administered 2024-04-23: 10 mg via INTRAVENOUS

## 2024-04-23 MED ORDER — PROPOFOL 1000 MG/100ML IV EMUL
INTRAVENOUS | Status: AC
  Filled 2024-04-23: qty 100

## 2024-04-23 MED ORDER — PROPOFOL 500 MG/50ML IV EMUL
INTRAVENOUS | Status: DC | PRN
  Administered 2024-04-23: 150 ug/kg/min via INTRAVENOUS

## 2024-04-23 NOTE — Anesthesia Preprocedure Evaluation (Signed)
 Anesthesia Evaluation  Patient identified by MRN, date of birth, ID band Patient awake    Reviewed: Allergy & Precautions, H&P , NPO status , Patient's Chart, lab work & pertinent test results, reviewed documented beta blocker date and time   Airway Mallampati: II   Neck ROM: full    Dental  (+) Poor Dentition   Pulmonary neg pulmonary ROS   Pulmonary exam normal        Cardiovascular Exercise Tolerance: Poor negative cardio ROS Normal cardiovascular exam Rhythm:regular Rate:Normal     Neuro/Psych  PSYCHIATRIC DISORDERS Anxiety Depression    negative neurological ROS     GI/Hepatic negative GI ROS, Neg liver ROS,,,  Endo/Other  negative endocrine ROS    Renal/GU negative Renal ROS  negative genitourinary   Musculoskeletal   Abdominal   Peds  Hematology negative hematology ROS (+)   Anesthesia Other Findings Past Medical History: No date: Anxiety No date: Depression Past Surgical History: 05/08/2019: COLONOSCOPY WITH PROPOFOL ; N/A     Comment:  Procedure: COLONOSCOPY WITH PROPOFOL ;  Surgeon:               Janalyn Keene NOVAK, MD;  Location: [facility] ENDOSCOPY;                Service: Endoscopy;  Laterality: N/A; No date: cyst wrist     Comment:  age 53 06/05/2012: EXAMINATION UNDER ANESTHESIA     Comment:  Procedure: EXAM UNDER ANESTHESIA;  Surgeon: Glenys GORMAN Birk, MD;  Location: [facility];  Service: Gynecology;                Laterality: N/A;  pap testing BMI    Body Mass Index: 27.07 kg/m     Reproductive/Obstetrics negative OB ROS                              Anesthesia Physical Anesthesia Plan  ASA: 2  Anesthesia Plan: General   Post-op Pain Management:    Induction:   PONV Risk Score and Plan:   Airway Management Planned:   Additional Equipment:   Intra-op Plan:   Post-operative Plan:   Informed Consent: I have reviewed the patients History and  Physical, chart, labs and discussed the procedure including the risks, benefits and alternatives for the proposed anesthesia with the patient or authorized representative who has indicated his/her understanding and acceptance.     Dental Advisory Given  Plan Discussed with: CRNA  Anesthesia Plan Comments:         Anesthesia Quick Evaluation

## 2024-04-23 NOTE — Op Note (Signed)
 [facility] Gastroenterology Patient Name: Regina Salazar Procedure Date: 04/23/2024 8:23 AM MRN: [id number] Account #: [id number] Date of Birth: 1970-09-11 Admit Type: Outpatient Age: 53 Room: [facility] ENDO ROOM 4 Gender: Female Note Status: Finalized Instrument Name: Colon Scope [id number] Procedure:             Colonoscopy Indications:           Family history of advanced adenoma of the colon in a                         first-degree relative before age 81 years Providers:             Rogelia Copping MD, MD Referring MD:          Ginger Patrick (Referring MD) Medicines:             Propofol  per Anesthesia Complications:         No immediate complications. Procedure:             Pre-Anesthesia Assessment:                        - Prior to the procedure, a History and Physical was                         performed, and patient medications and allergies were                         reviewed. The patient's tolerance of previous                         anesthesia was also reviewed. The risks and benefits                         of the procedure and the sedation options and risks                         were discussed with the patient. All questions were                         answered, and informed consent was obtained. Prior                         Anticoagulants: The patient has taken no anticoagulant                         or antiplatelet agents. ASA Grade Assessment: II - A                         patient with mild systemic disease. After reviewing                         the risks and benefits, the patient was deemed in                         satisfactory condition to undergo the procedure.                        After obtaining informed consent, the colonoscope was  passed under direct vision. Throughout the procedure,                         the patient's blood pressure, pulse, and oxygen                         saturations were monitored  continuously. The                         Colonoscope was introduced through the anus and                         advanced to the the cecum, identified by appendiceal                         orifice and ileocecal valve. The colonoscopy was                         performed without difficulty. The patient tolerated                         the procedure well. The quality of the bowel                         preparation was excellent. Findings:      The perianal and digital rectal examinations were normal.      Non-bleeding internal hemorrhoids were found during retroflexion. The       hemorrhoids were Grade II (internal hemorrhoids that prolapse but reduce       spontaneously). Impression:            - Non-bleeding internal hemorrhoids.                        - No specimens collected. Recommendation:        - Discharge patient to home.                        - Resume previous diet.                        - Continue present medications.                        - Repeat colonoscopy in 5 years for surveillance. Procedure Code(s):     --- Professional ---                        [phone], Colonoscopy, flexible; diagnostic, including                         collection of specimen(s) by brushing or washing, when                         performed (separate procedure) Diagnosis Code(s):     --- Professional ---                        Z83.71, Family history of colonic polyps CPT copyright 2022 American Medical Association. All rights reserved. The codes documented in this report are preliminary and upon coder review may  be  revised to meet current compliance requirements. Rogelia Copping MD, MD 04/23/2024 8:40:44 AM This report has been signed electronically. Number of Addenda: 0 Note Initiated On: 04/23/2024 8:23 AM Scope Withdrawal Time: 0 hours 6 minutes 11 seconds  Total Procedure Duration: 0 hours 8 minutes 50 seconds  Estimated Blood Loss:  Estimated blood loss: none.      [facility]

## 2024-04-23 NOTE — H&P (Signed)
 Rogelia Copping, MD [facility] [address] [place], [place] [address] Phone:[phone] Fax : [phone]  Primary Care Physician:  Corwin Antu, FNP Primary Gastroenterologist:  Dr. Copping  Pre-Procedure History & Physical: HPI:  Regina Salazar is a 53 y.o. female is here for an colonoscopy.   Past Medical History:  Diagnosis Date   Anxiety    Depression     Past Surgical History:  Procedure Laterality Date   COLONOSCOPY WITH PROPOFOL  N/A 05/08/2019   Procedure: COLONOSCOPY WITH PROPOFOL ;  Surgeon: Janalyn Keene NOVAK, MD;  Location: [facility] ENDOSCOPY;  Service: Endoscopy;  Laterality: N/A;   cyst wrist     age 55   EXAMINATION UNDER ANESTHESIA  06/05/2012   Procedure: EXAM UNDER ANESTHESIA;  Surgeon: Glenys GORMAN Birk, MD;  Location: [facility];  Service: Gynecology;  Laterality: N/A;  pap testing    Prior to Admission medications   Medication Sig Start Date End Date Taking? Authorizing Provider  pravastatin  (PRAVACHOL ) 80 MG tablet Take 1 tablet (80 mg total) by mouth daily. 10/23/23  Yes Corwin Antu, FNP    Allergies as of 11/19/2023 - Review Complete 10/23/2023  Allergen Reaction Noted   Prednisone  Anxiety 06/09/2014    Family History  Problem Relation Age of Onset   Breast cancer Mother 23   Colon cancer Father    Diabetes Father    Colon cancer Paternal Grandfather    Breast cancer Other     Social History   Socioeconomic History   Marital status: Married    Spouse name: Not on file   Number of children: 3   Years of education: Not on file   Highest education level: Some college, no degree  Occupational History    Comment: house keeping  Tobacco Use   Smoking status: Never    Passive exposure: Never   Smokeless tobacco: Never  Vaping Use   Vaping status: Never Used  Substance and Sexual Activity   Alcohol use: No   Drug use: No   Sexual activity: Yes    Partners: Male    Birth control/protection: Post-menopausal  Other Topics Concern   Not  on file  Social History Narrative   Moved here from [place]  last year for her husband's job.3 adopted children ages 76, 66, 46.   Social Drivers of Corporate Investment Banker Strain: Low Risk  (10/22/2023)   Overall Financial Resource Strain (CARDIA)    Difficulty of Paying Living Expenses: Not hard at all  Food Insecurity: No Food Insecurity (10/22/2023)   Hunger Vital Sign    Worried About Running Out of Food in the Last Year: Never true    Ran Out of Food in the Last Year: Never true  Transportation Needs: No Transportation Needs (10/22/2023)   PRAPARE - Administrator, Civil Service (Medical): No    Lack of Transportation (Non-Medical): No  Physical Activity: Insufficiently Active (10/22/2023)   Exercise Vital Sign    Days of Exercise per Week: 1 day    Minutes of Exercise per Session: 40 min  Stress: Stress Concern Present (10/22/2023)   [employer] of Occupational Health - Occupational Stress Questionnaire    Feeling of Stress : To some extent  Social Connections: Unknown (10/22/2023)   Social Connection and Isolation Panel    Frequency of Communication with Friends and Family: Once a week    Frequency of Social Gatherings with Friends and Family: Never    Attends Religious Services: Patient declined  Active Member of Clubs or Organizations: No    Attends Engineer, Structural: Not on file    Marital Status: Married  Catering Manager Violence: Not on file    Review of Systems: See HPI, otherwise negative ROS  Physical Exam: BP (!) 144/90   Pulse 78   Temp (!) 96 F (35.6 C) (Temporal)   Resp 18   Ht 5' 2 (1.575 m)   Wt 67.1 kg   SpO2 98%   BMI 27.07 kg/m  General:   Alert,  pleasant and cooperative in NAD Head:  Normocephalic and atraumatic. Neck:  Supple; no masses or thyromegaly. Lungs:  Clear throughout to auscultation.    Heart:  Regular rate and rhythm. Abdomen:  Soft, nontender and nondistended. Normal bowel sounds, without guarding,  and without rebound.   Neurologic:  Alert and  oriented x4;  grossly normal neurologically.  Impression/Plan: Regina Salazar is here for an colonoscopy to be performed for a history of adenomatous polyps on 2020 and family history of colon cancer  Risks, benefits, limitations, and alternatives regarding  colonoscopy have been reviewed with the patient.  Questions have been answered.  All parties agreeable.   Rogelia Copping, MD  04/23/2024, 8:22 AM

## 2024-04-23 NOTE — Transfer of Care (Signed)
 Immediate Anesthesia Transfer of Care Note  Patient: Regina Salazar  Procedure(s) Performed: COLONOSCOPY  Patient Location: PACU  Anesthesia Type:General  Level of Consciousness: awake and sedated  Airway & Oxygen Therapy: Patient Spontanous Breathing and Patient connected to face mask oxygen  Post-op Assessment: Post -op Vital signs reviewed and stable  Post vital signs: Reviewed and stable  Last Vitals:  Vitals Value Taken Time  BP    Temp    Pulse    Resp    SpO2      Last Pain:  Vitals:   04/23/24 0751  TempSrc: Temporal  PainSc: 0-No pain         Complications: There were no known notable events for this encounter.

## 2024-04-23 NOTE — Anesthesia Postprocedure Evaluation (Signed)
 Anesthesia Post Note  Patient: Regina Salazar  Procedure(s) Performed: COLONOSCOPY  Patient location during evaluation: PACU Anesthesia Type: General Level of consciousness: awake and alert Pain management: pain level controlled Vital Signs Assessment: post-procedure vital signs reviewed and stable Respiratory status: spontaneous breathing, nonlabored ventilation, respiratory function stable and patient connected to nasal cannula oxygen Cardiovascular status: blood pressure returned to baseline and stable Postop Assessment: no apparent nausea or vomiting Anesthetic complications: no   There were no known notable events for this encounter.   Last Vitals:  Vitals:   04/23/24 0849 04/23/24 0854  BP: 124/77 132/86  Pulse: 82 87  Resp: (!) 22 19  Temp:    SpO2: 98% 96%    Last Pain:  Vitals:   04/23/24 0854  TempSrc:   PainSc: 0-No pain                 Lynwood KANDICE Clause

## 2024-04-24 ENCOUNTER — Encounter: Payer: Self-pay | Admitting: Gastroenterology

## 2024-06-12 ENCOUNTER — Ambulatory Visit
Admission: RE | Admit: 2024-06-12 | Discharge: 2024-06-12 | Disposition: A | Discharge status: Home / Self Care | Source: Ambulatory Visit | Attending: Family | Admitting: Family

## 2024-06-12 DIAGNOSIS — Z1231 Encounter for screening mammogram for malignant neoplasm of breast: Secondary | ICD-10-CM

## 2024-06-19 ENCOUNTER — Ambulatory Visit: Payer: Self-pay | Admitting: Family

## 2024-07-10 ENCOUNTER — Ambulatory Visit: Admitting: Family

## 2024-07-10 ENCOUNTER — Encounter: Payer: Self-pay | Admitting: Family

## 2024-07-10 VITALS — BP 116/76 | HR 66 | Temp 98.6°F | Ht 62.0 in | Wt 153.2 lb

## 2024-07-10 DIAGNOSIS — Z6831 Body mass index (BMI) 31.0-31.9, adult: Secondary | ICD-10-CM | POA: Diagnosis not present

## 2024-07-10 DIAGNOSIS — Z0000 Encounter for general adult medical examination without abnormal findings: Secondary | ICD-10-CM

## 2024-07-10 DIAGNOSIS — E66811 Obesity, class 1: Secondary | ICD-10-CM | POA: Diagnosis not present

## 2024-07-10 DIAGNOSIS — E6609 Other obesity due to excess calories: Secondary | ICD-10-CM

## 2024-07-10 DIAGNOSIS — E7800 Pure hypercholesterolemia, unspecified: Secondary | ICD-10-CM | POA: Diagnosis not present

## 2024-07-10 DIAGNOSIS — R7303 Prediabetes: Secondary | ICD-10-CM | POA: Diagnosis not present

## 2024-07-10 LAB — COMPREHENSIVE METABOLIC PANEL WITH GFR
ALT: 23 U/L (ref 3–35)
AST: 23 U/L (ref 5–37)
Albumin: 4.8 g/dL (ref 3.5–5.2)
Alkaline Phosphatase: 59 U/L (ref 39–117)
BUN: 18 mg/dL (ref 6–23)
CO2: 26 meq/L (ref 19–32)
Calcium: 9.5 mg/dL (ref 8.4–10.5)
Chloride: 105 meq/L (ref 96–112)
Creatinine, Ser: 0.77 mg/dL (ref 0.40–1.20)
GFR: 87.15 mL/min
Glucose, Bld: 97 mg/dL (ref 70–99)
Potassium: 4.3 meq/L (ref 3.5–5.1)
Sodium: 140 meq/L (ref 135–145)
Total Bilirubin: 0.7 mg/dL (ref 0.2–1.2)
Total Protein: 7.7 g/dL (ref 6.0–8.3)

## 2024-07-10 LAB — LIPID PANEL
Cholesterol: 163 mg/dL (ref 28–200)
HDL: 63.4 mg/dL
LDL Cholesterol: 79 mg/dL (ref 10–99)
NonHDL: 99.57
Total CHOL/HDL Ratio: 3
Triglycerides: 101 mg/dL (ref 10.0–149.0)
VLDL: 20.2 mg/dL (ref 0.0–40.0)

## 2024-07-10 LAB — HEMOGLOBIN A1C: Hgb A1c MFr Bld: 6.2 % (ref 4.6–6.5)

## 2024-07-10 NOTE — Progress Notes (Addendum)
 "  Subjective:  Patient ID: Regina Salazar, female    DOB: [birth year]  Age: 54 y.o. MRN: [id number]  Patient Care Team: Corwin Antu, FNP as PCP - General (Family Medicine)   CC:  Chief Complaint  Patient presents with   Annual Exam    HPI Regina Salazar is a 54 y.o. female who presents today for an annual physical exam. She reports consuming a general diet. The patient has a physically strenuous job, but has no regular exercise apart from work.  She generally feels well. She reports sleeping fairly well. She does not have additional problems to discuss today.   Vision:Within last year Dental:Receives regular dental care  Mammogram: 06/12/24 Last pap: declines  Colonoscopy: 04/23/24 every five years   Pt is without acute concerns.   Discussed the use of AI scribe software for clinical note transcription with the patient, who gave verbal consent to proceed.  History of Present Illness Regina Salazar is a 54 year old female who presents for an annual physical exam.  She declined a Pap smear due to anxiety associated with the procedure.   She is currently taking pravastatin  for cholesterol management. Her anxiety is manageable, with fluctuations depending on life events.    Her allergies have been bothersome this year, and she has not been using any medication like Flonase for relief.   Advanced Directives Patient does not have advanced directives     DEPRESSION SCREENING    07/10/2024    8:00 AM 05/07/2023    8:36 AM 10/30/2022    8:50 AM 08/31/2021    3:55 PM 06/26/2021   11:38 AM 06/26/2021   11:13 AM 11/09/2020    1:56 PM  PHQ 2/9 Scores  PHQ - 2 Score 0 0 0 0 1 0 0  PHQ- 9 Score 0 0  0  0  5        Data saved with a previous flowsheet row definition     ROS: Negative unless specifically indicated above in HPI.   Current Medications[1]    Objective:    BP 116/76 (BP Location: Left Arm, Patient Position: Sitting, Cuff Size: Normal)   Pulse 66   Temp  98.6 F (37 C) (Temporal)   Ht 5' 2 (1.575 m)   Wt 153 lb 3.2 oz (69.5 kg)   LMP 08/10/2021   SpO2 98%   BMI 28.02 kg/m   BP Readings from Last 3 Encounters:  07/10/24 116/76  04/23/24 (!) 133/90  01/01/24 (!) 148/84      Physical Exam Vitals reviewed.  Constitutional:      General: She is not in acute distress.    Appearance: Normal appearance. She is normal weight. She is not ill-appearing.  HENT:     Head: Normocephalic.     Right Ear: Tympanic membrane normal.     Left Ear: Tympanic membrane normal.     Nose: Nose normal.     Mouth/Throat:     Mouth: Mucous membranes are moist.  Eyes:     Extraocular Movements: Extraocular movements intact.     Pupils: Pupils are equal, round, and reactive to light.  Cardiovascular:     Rate and Rhythm: Normal rate and regular rhythm.  Pulmonary:     Effort: Pulmonary effort is normal.     Breath sounds: Normal breath sounds.  Abdominal:     General: Abdomen is flat. Bowel sounds are normal.     Palpations: Abdomen is soft.  Tenderness: There is no guarding or rebound.  Musculoskeletal:        General: Normal range of motion.     Cervical back: Normal range of motion.  Skin:    General: Skin is warm.     Capillary Refill: Capillary refill takes less than 2 seconds.  Neurological:     General: No focal deficit present.     Mental Status: She is alert.  Psychiatric:        Mood and Affect: Mood normal.        Behavior: Behavior normal.        Thought Content: Thought content normal.        Judgment: Judgment normal.       Results       Assessment & Plan:   Assessment and Plan Assessment & Plan Annual physical examination Routine annual physical examination with no significant complaints or changes in health status. Up to date with mammogram and colonoscopy. Declined Pap smear due to anxiety. No smoking history since 20s. Anxiety is well-managed with occasional fluctuations. No changes in family history.  Normal bowel movements and no gastrointestinal issues. Allergies have not been well-controlled. - Continue routine annual physical examinations. - Encouraged maintaining up-to-date screenings, including mammograms and colonoscopies. - Discussed the importance of having an advanced directive or living will. -Patient Counseling(The following topics were reviewed):  Preventative care handout given to pt  Health maintenance and immunizations reviewed. Please refer to Health maintenance section. Pt advised on safe sex, wearing seatbelts in car, and proper nutrition labwork ordered today for annual Dental health: Discussed importance of regular tooth brushing, flossing, and dental visits.   Pure hypercholesterolemia Managed with pravastatin  80 mg daily. No new symptoms or concerns related to hypercholesterolemia. - Continue pravastatin  80 mg oral daily. - Ordered cholesterol panel to monitor lipid levels.  Prediabetes No new symptoms or concerns related to prediabetes. - Ordered A1c test to monitor glucose levels.  Class 1 obesity Due to excess calories without serious comorbidity. No new symptoms or concerns related to obesity.  Allergic rhinitis Mild allergic rhinitis with nasal congestion. No current use of nasal sprays. - Recommended Flonase nasal spray, 1-2 sprays in each nostril daily for two weeks, then continue as needed through the allergy season.          Follow-up: Return in about 1 year (around 07/10/2025) for f/u CPE.   Ginger Patrick, FNP     [1]  Current Outpatient Medications:    pravastatin  (PRAVACHOL ) 80 MG tablet, Take 1 tablet (80 mg total) by mouth daily., Disp: 90 tablet, Rfl: 3  "

## 2024-07-11 ENCOUNTER — Ambulatory Visit: Payer: Self-pay | Admitting: Family

## 2024-10-01 ENCOUNTER — Ambulatory Visit: Admitting: Dermatology
